# Patient Record
Sex: Male | Born: 1970 | Race: White | Hispanic: No | Marital: Married | Smoking: Former smoker
Health system: Southern US, Community
[De-identification: ages and names within clinical notes are randomized; demographics above are authoritative.]

## PROBLEM LIST (undated history)

## (undated) DIAGNOSIS — R42 Dizziness and giddiness: Secondary | ICD-10-CM

## (undated) DIAGNOSIS — R2689 Other abnormalities of gait and mobility: Secondary | ICD-10-CM

## (undated) DIAGNOSIS — M1990 Unspecified osteoarthritis, unspecified site: Secondary | ICD-10-CM

## (undated) DIAGNOSIS — R519 Headache, unspecified: Secondary | ICD-10-CM

## (undated) DIAGNOSIS — F1910 Other psychoactive substance abuse, uncomplicated: Secondary | ICD-10-CM

## (undated) DIAGNOSIS — E7800 Pure hypercholesterolemia, unspecified: Secondary | ICD-10-CM

## (undated) DIAGNOSIS — R7303 Prediabetes: Secondary | ICD-10-CM

## (undated) DIAGNOSIS — R748 Abnormal levels of other serum enzymes: Secondary | ICD-10-CM

## (undated) DIAGNOSIS — K219 Gastro-esophageal reflux disease without esophagitis: Secondary | ICD-10-CM

## (undated) DIAGNOSIS — K429 Umbilical hernia without obstruction or gangrene: Secondary | ICD-10-CM

## (undated) DIAGNOSIS — Z87442 Personal history of urinary calculi: Secondary | ICD-10-CM

## (undated) DIAGNOSIS — M549 Dorsalgia, unspecified: Secondary | ICD-10-CM

## (undated) HISTORY — PX: UPPER GASTROINTESTINAL ENDOSCOPY: SHX188

## (undated) HISTORY — DX: Other abnormalities of gait and mobility: R26.89

## (undated) HISTORY — PX: COLONOSCOPY: SHX174

## (undated) HISTORY — DX: Dizziness and giddiness: R42

## (undated) HISTORY — PX: HERNIA REPAIR: SHX51

## (undated) HISTORY — DX: Headache, unspecified: R51.9

## (undated) HISTORY — DX: Other psychoactive substance abuse, uncomplicated: F19.10

## (undated) HISTORY — DX: Prediabetes: R73.03

## (undated) HISTORY — DX: Pure hypercholesterolemia, unspecified: E78.00

## (undated) HISTORY — PX: OTHER SURGICAL HISTORY: SHX169

---

## 2008-08-14 ENCOUNTER — Ambulatory Visit: Payer: Self-pay | Admitting: Internal Medicine

## 2008-08-14 DIAGNOSIS — Z8639 Personal history of other endocrine, nutritional and metabolic disease: Secondary | ICD-10-CM

## 2008-08-14 DIAGNOSIS — J309 Allergic rhinitis, unspecified: Secondary | ICD-10-CM | POA: Insufficient documentation

## 2008-08-14 DIAGNOSIS — K219 Gastro-esophageal reflux disease without esophagitis: Secondary | ICD-10-CM | POA: Insufficient documentation

## 2008-08-14 DIAGNOSIS — Z862 Personal history of diseases of the blood and blood-forming organs and certain disorders involving the immune mechanism: Secondary | ICD-10-CM | POA: Insufficient documentation

## 2008-08-14 LAB — CONVERTED CEMR LAB
ALT: 65 units/L — ABNORMAL HIGH (ref 0–53)
AST: 35 units/L (ref 0–37)
Alkaline Phosphatase: 76 units/L (ref 39–117)
Bilirubin, Direct: 0.2 mg/dL (ref 0.0–0.3)
Total Bilirubin: 1 mg/dL (ref 0.3–1.2)

## 2008-08-16 ENCOUNTER — Telehealth: Payer: Self-pay | Admitting: Internal Medicine

## 2009-01-08 ENCOUNTER — Encounter: Payer: Self-pay | Admitting: Internal Medicine

## 2012-01-17 ENCOUNTER — Ambulatory Visit (INDEPENDENT_AMBULATORY_CARE_PROVIDER_SITE_OTHER): Payer: BC Managed Care – PPO | Admitting: Internal Medicine

## 2012-01-17 VITALS — BP 158/90 | HR 83 | Temp 98.9°F | Resp 16 | Ht 73.0 in | Wt 248.0 lb

## 2012-01-17 DIAGNOSIS — J029 Acute pharyngitis, unspecified: Secondary | ICD-10-CM

## 2012-01-17 DIAGNOSIS — J020 Streptococcal pharyngitis: Secondary | ICD-10-CM

## 2012-01-17 MED ORDER — CEFDINIR 300 MG PO CAPS: 300.0000 mg | ORAL_CAPSULE | Freq: Two times a day (BID) | ORAL | Status: AC

## 2012-01-17 NOTE — Patient Instructions (Signed)
Strep pharyngitis:  Take all you medication as prescribed.  You may take 800 mg ibuprophen every 8 hours for 2-3 days for pain and fever.  RTC if not much better in 2 days.  Strep Throat Strep throat is an infection of the throat caused by a bacteria named Streptococcus pyogenes. Your caregiver may call the infection streptococcal "tonsillitis" or "pharyngitis" depending on whether there are signs of inflammation in the tonsils or back of the throat. Strep throat is most common in children from 5 to 20 years old during the cold months of the year, but it can occur in people of any age during any season. This infection is spread from person to person (contagious) through coughing, sneezing, or other close contact. SYMPTOMS   Fever or chills.   Painful, swollen, red tonsils or throat.   Pain or difficulty when swallowing.   Wandel or yellow spots on the tonsils or throat.   Swollen, tender lymph nodes or "glands" of the neck or under the jaw.   Red rash all over the body (rare).  DIAGNOSIS  Many different infections can cause the same symptoms. A test must be done to confirm the diagnosis so the right treatment can be given. A "rapid strep test" can help your caregiver make the diagnosis in a few minutes. If this test is not available, a light swab of the infected area can be used for a throat culture test. If a throat culture test is done, results are usually available in a day or two. TREATMENT  Strep throat is treated with antibiotic medicine. HOME CARE INSTRUCTIONS   Gargle with 1 tsp of salt in 1 cup of warm water, 3 to 4 times per day or as needed for comfort.   Family members who also have a sore throat or fever should be tested for strep throat and treated with antibiotics if they have the strep infection.   Make sure everyone in your household washes their hands well.   Do not share food, drinking cups, or personal items that could cause the infection to spread to others.   You  may need to eat a soft food diet until your sore throat gets better.   Drink enough water and fluids to keep your urine clear or pale yellow. This will help prevent dehydration.   Get plenty of rest.   Stay home from school, daycare, or work until you have been on antibiotics for 24 hours.   Only take over-the-counter or prescription medicines for pain, discomfort, or fever as directed by your caregiver.   If antibiotics are prescribed, take them as directed. Finish them even if you start to feel better.  SEEK MEDICAL CARE IF:   The glands in your neck continue to enlarge.   You develop a rash, cough, or earache.   You cough up green, yellow-brown, or bloody sputum.   You have pain or discomfort not controlled by medicines.   Your problems seem to be getting worse rather than better.  SEEK IMMEDIATE MEDICAL CARE IF:   You develop any new symptoms such as vomiting, severe headache, stiff or painful neck, chest pain, shortness of breath, or trouble swallowing.   You develop severe throat pain, drooling, or changes in your voice.   You develop swelling of the neck, or the skin on the neck becomes red and tender.   You have a fever.   You develop signs of dehydration, such as fatigue, dry mouth, and decreased urination.   You  become increasingly sleepy, or you cannot wake up completely.  Document Released: 10/23/2000 Document Revised: 10/15/2011 Document Reviewed: 12/25/2010 [facility] Patient Information 2012 [place], [place].

## 2012-01-17 NOTE — Progress Notes (Signed)
  Subjective:    Patient ID: Brian Pierce, male    DOB: [birth year], 41 y.o.   MRN: [id number]  Sore Throat  This is a new problem. The current episode started yesterday. The problem has been unchanged. Neither side of throat is experiencing more pain than the other. Pertinent negatives include no abdominal pain, coughing, neck pain, shortness of breath or vomiting. He has had exposure to strep.  Brian Pierce is a pt of Dr. Tempie Donning who has had a sore throat for 2 days, daughter is 49 years old and was recently diagnosed with strep.  He feels feverish and has body aches as well.    Review of Systems  Constitutional: Positive for fever.  HENT: Negative for neck pain.   Eyes: Negative.   Respiratory: Negative.  Negative for cough and shortness of breath.   Cardiovascular: Negative.   Gastrointestinal: Negative.  Negative for vomiting and abdominal pain.  Genitourinary: Negative.   Musculoskeletal: Positive for myalgias.  Skin: Negative for rash.  Neurological: Negative.   Hematological: Negative.   Psychiatric/Behavioral: Negative.        Objective:   Physical Exam  Vitals reviewed. Constitutional: He is oriented to person, place, and time. He appears well-developed and well-nourished.       obese  HENT:  Head: Normocephalic.  Right Ear: External ear normal.  Left Ear: External ear normal.       Oropharynx red with bloody swab for RS  Eyes: Conjunctivae are normal.  Neck: Neck supple.  Cardiovascular: Normal rate, regular rhythm and normal heart sounds.   Abdominal: Soft.  Musculoskeletal: Normal range of motion.  Lymphadenopathy:    He has no cervical adenopathy.  Neurological: He is alert and oriented to person, place, and time.  Skin: Skin is warm and dry.  Psychiatric: He has a normal mood and affect. His behavior is normal.          Assessment & Plan:  Strep pharyngitis:  Cefdinir 300 mg BID for 7 days.  Ibuprophen 800 mg TID for 2 days.  AVS printed and  given to pt, should be able to work after 24 hours on antibiotic.

## 2015-03-14 ENCOUNTER — Other Ambulatory Visit: Payer: Self-pay | Admitting: Internal Medicine

## 2015-03-14 DIAGNOSIS — R748 Abnormal levels of other serum enzymes: Secondary | ICD-10-CM

## 2015-03-21 ENCOUNTER — Other Ambulatory Visit: Payer: Self-pay

## 2015-09-02 ENCOUNTER — Other Ambulatory Visit (HOSPITAL_COMMUNITY): Payer: Self-pay | Admitting: Respiratory Therapy

## 2015-09-02 DIAGNOSIS — J9801 Acute bronchospasm: Secondary | ICD-10-CM

## 2015-09-10 ENCOUNTER — Ambulatory Visit (HOSPITAL_COMMUNITY)
Admission: RE | Admit: 2015-09-10 | Discharge: 2015-09-10 | Disposition: A | Discharge status: Home / Self Care | Payer: 59 | Source: Ambulatory Visit | Attending: Internal Medicine | Admitting: Internal Medicine

## 2015-09-10 DIAGNOSIS — J9801 Acute bronchospasm: Secondary | ICD-10-CM | POA: Diagnosis not present

## 2015-09-10 LAB — PULMONARY FUNCTION TEST
DL/VA % pred: 117 %
DL/VA: 5.53 ml/min/mmHg/L
DLCO UNC % PRED: 108 %
DLCO UNC: 36.64 ml/min/mmHg
FEF 25-75 PRE: 3.8 L/s
FEF 25-75 Post: 1.88 L/sec
FEF2575-%Change-Post: -50 %
FEF2575-%Pred-Post: 50 %
FEF2575-%Pred-Pre: 101 %
FEV1-%CHANGE-POST: -32 %
FEV1-%PRED-POST: 61 %
FEV1-%Pred-Pre: 90 %
FEV1-POST: 2.55 L
FEV1-Pre: 3.79 L
FEV1FVC-%Change-Post: -28 %
FEV1FVC-%PRED-PRE: 102 %
FEV6-%Change-Post: -5 %
FEV6-%PRED-POST: 85 %
FEV6-%Pred-Pre: 90 %
FEV6-POST: 4.43 L
FEV6-PRE: 4.69 L
FEV6FVC-%PRED-POST: 103 %
FEV6FVC-%PRED-PRE: 103 %
FVC-%Change-Post: -5 %
FVC-%Pred-Post: 83 %
FVC-%Pred-Pre: 88 %
FVC-PRE: 4.69 L
FVC-Post: 4.43 L
PRE FEV6/FVC RATIO: 100 %
Post FEV1/FVC ratio: 57 %
Post FEV6/FVC ratio: 100 %
Pre FEV1/FVC ratio: 81 %
RV % PRED: 97 %
RV: 1.95 L
TLC % PRED: 109 %
TLC: 7.86 L

## 2015-09-10 MED ORDER — ALBUTEROL SULFATE (2.5 MG/3ML) 0.083% IN NEBU
2.5000 mg | INHALATION_SOLUTION | Freq: Once | RESPIRATORY_TRACT | Status: AC
  Administered 2015-09-10: 2.5 mg via RESPIRATORY_TRACT

## 2015-12-23 DIAGNOSIS — N390 Urinary tract infection, site not specified: Secondary | ICD-10-CM | POA: Diagnosis not present

## 2015-12-23 DIAGNOSIS — Z125 Encounter for screening for malignant neoplasm of prostate: Secondary | ICD-10-CM | POA: Diagnosis not present

## 2015-12-23 DIAGNOSIS — Z79899 Other long term (current) drug therapy: Secondary | ICD-10-CM | POA: Diagnosis not present

## 2015-12-23 DIAGNOSIS — Z0000 Encounter for general adult medical examination without abnormal findings: Secondary | ICD-10-CM | POA: Diagnosis not present

## 2015-12-23 DIAGNOSIS — Z13220 Encounter for screening for lipoid disorders: Secondary | ICD-10-CM | POA: Diagnosis not present

## 2016-01-13 DIAGNOSIS — R05 Cough: Secondary | ICD-10-CM | POA: Diagnosis not present

## 2016-01-13 DIAGNOSIS — J40 Bronchitis, not specified as acute or chronic: Secondary | ICD-10-CM | POA: Diagnosis not present

## 2016-01-13 MED FILL — levoFLOXacin 750 MG TABS: 750 | 5 days supply | Qty: 5 | Fill #0

## 2016-01-13 MED FILL — CHERATUSSIN AC SYRUP: 100-10 | 6 days supply | Qty: 60 | Fill #0

## 2016-01-13 MED FILL — predniSONE 10 MG (21) TBPK: 10 | 6 days supply | Qty: 21 | Fill #0

## 2016-01-20 DIAGNOSIS — R748 Abnormal levels of other serum enzymes: Secondary | ICD-10-CM | POA: Diagnosis not present

## 2016-01-28 DIAGNOSIS — Z1212 Encounter for screening for malignant neoplasm of rectum: Secondary | ICD-10-CM | POA: Diagnosis not present

## 2016-01-28 DIAGNOSIS — R7309 Other abnormal glucose: Secondary | ICD-10-CM | POA: Diagnosis not present

## 2016-01-28 DIAGNOSIS — Z6833 Body mass index (BMI) 33.0-33.9, adult: Secondary | ICD-10-CM | POA: Diagnosis not present

## 2016-01-28 DIAGNOSIS — Z0000 Encounter for general adult medical examination without abnormal findings: Secondary | ICD-10-CM | POA: Diagnosis not present

## 2016-01-28 DIAGNOSIS — F1010 Alcohol abuse, uncomplicated: Secondary | ICD-10-CM | POA: Diagnosis not present

## 2016-02-14 MED FILL — FLUTICASONE PROP 50 MCG SPR: 50 | 60 days supply | Qty: 16 | Fill #0

## 2016-02-24 MED FILL — ARNUITY ELLIPTA 100 MCG INH: 100 | 30 days supply | Qty: 30 | Fill #0

## 2016-03-20 MED FILL — OMEPRAZOLE DR 20 MG CAPSULE: 20 | 90 days supply | Qty: 90 | Fill #0

## 2016-03-27 MED FILL — ARNUITY ELLIPTA 100 MCG INH: 100 | 30 days supply | Qty: 30 | Fill #1

## 2016-04-21 MED FILL — FLUTICASONE PROP 50 MCG SPR: 50 | 60 days supply | Qty: 16 | Fill #1

## 2016-04-30 MED FILL — ARNUITY ELLIPTA 100 MCG INH: 100 | 30 days supply | Qty: 30 | Fill #2

## 2016-06-08 MED FILL — FLUTICASONE PROP 50 MCG SPR: 50 | 60 days supply | Qty: 16 | Fill #2

## 2016-06-08 MED FILL — ARNUITY ELLIPTA 100 MCG INH: 100 | 30 days supply | Qty: 30 | Fill #3

## 2016-07-20 MED FILL — ARNUITY ELLIPTA 100 MCG INH: 100 | 30 days supply | Qty: 30 | Fill #4

## 2016-08-31 MED FILL — ARNUITY ELLIPTA 100 MCG INH: 100 | 30 days supply | Qty: 30 | Fill #5

## 2016-08-31 MED FILL — FLUTICASONE PROP 50 MCG SPR: 50 | 60 days supply | Qty: 16 | Fill #3

## 2016-09-30 ENCOUNTER — Emergency Department (HOSPITAL_COMMUNITY)
Admission: EM | Admit: 2016-09-30 | Discharge: 2016-09-30 | Disposition: A | Discharge status: Home / Self Care | Payer: 59 | Attending: Emergency Medicine | Admitting: Emergency Medicine

## 2016-09-30 ENCOUNTER — Emergency Department (HOSPITAL_COMMUNITY): Payer: 59

## 2016-09-30 ENCOUNTER — Encounter (HOSPITAL_COMMUNITY): Payer: Self-pay

## 2016-09-30 DIAGNOSIS — N202 Calculus of kidney with calculus of ureter: Secondary | ICD-10-CM | POA: Insufficient documentation

## 2016-09-30 DIAGNOSIS — N201 Calculus of ureter: Secondary | ICD-10-CM | POA: Diagnosis not present

## 2016-09-30 DIAGNOSIS — N200 Calculus of kidney: Secondary | ICD-10-CM | POA: Diagnosis not present

## 2016-09-30 DIAGNOSIS — R1030 Lower abdominal pain, unspecified: Secondary | ICD-10-CM | POA: Diagnosis present

## 2016-09-30 LAB — CBC WITH DIFFERENTIAL/PLATELET
Basophils Absolute: 0 10*3/uL (ref 0.0–0.1)
Basophils Relative: 0 %
EOS PCT: 2 %
Eosinophils Absolute: 0.2 10*3/uL (ref 0.0–0.7)
HCT: 45.4 % (ref 39.0–52.0)
HEMOGLOBIN: 16.2 g/dL (ref 13.0–17.0)
LYMPHS ABS: 4.3 10*3/uL — AB (ref 0.7–4.0)
LYMPHS PCT: 43 %
MCH: 31.6 pg (ref 26.0–34.0)
MCHC: 35.7 g/dL (ref 30.0–36.0)
MCV: 88.7 fL (ref 78.0–100.0)
MONOS PCT: 8 %
Monocytes Absolute: 0.8 10*3/uL (ref 0.1–1.0)
Neutro Abs: 4.6 10*3/uL (ref 1.7–7.7)
Neutrophils Relative %: 47 %
Platelets: 219 10*3/uL (ref 150–400)
RBC: 5.12 MIL/uL (ref 4.22–5.81)
RDW: 12.2 % (ref 11.5–15.5)
WBC: 9.9 10*3/uL (ref 4.0–10.5)

## 2016-09-30 LAB — URINALYSIS, ROUTINE W REFLEX MICROSCOPIC
Bilirubin Urine: NEGATIVE
GLUCOSE, UA: NEGATIVE mg/dL
KETONES UR: NEGATIVE mg/dL
LEUKOCYTES UA: NEGATIVE
Nitrite: NEGATIVE
PH: 6 (ref 5.0–8.0)
PROTEIN: NEGATIVE mg/dL
Specific Gravity, Urine: 1.027 (ref 1.005–1.030)

## 2016-09-30 LAB — BASIC METABOLIC PANEL
Anion gap: 11 (ref 5–15)
BUN: 18 mg/dL (ref 6–20)
CHLORIDE: 106 mmol/L (ref 101–111)
CO2: 22 mmol/L (ref 22–32)
Calcium: 9.5 mg/dL (ref 8.9–10.3)
Creatinine, Ser: 0.99 mg/dL (ref 0.61–1.24)
GFR calc Af Amer: >60 mL/min (ref >60)
GFR calc non Af Amer: >60 mL/min (ref >60)
GLUCOSE: 110 mg/dL — AB (ref 65–99)
POTASSIUM: 3.8 mmol/L (ref 3.5–5.1)
Sodium: 139 mmol/L (ref 135–145)

## 2016-09-30 LAB — URINE MICROSCOPIC-ADD ON

## 2016-09-30 IMAGING — CT CT RENAL STONE PROTOCOL
2 of 4 series · 16 of 46 positions shown, 18 images · non-contrast
Comparison: None.

CLINICAL DATA: Right flank pain radiating to the right groin,
starting at 1 a.m..

EXAM:
CT ABDOMEN AND PELVIS WITHOUT CONTRAST
TECHNIQUE: Multidetector CT imaging of the abdomen and pelvis was performed
following the standard protocol without IV contrast.

[Series 2: renal stone 5.0 · axial · 0.82mm/px · z∈[-899,-409]mm · 13 of 108 slices shown, 15 images]
[im 5/108  soft-tissue]
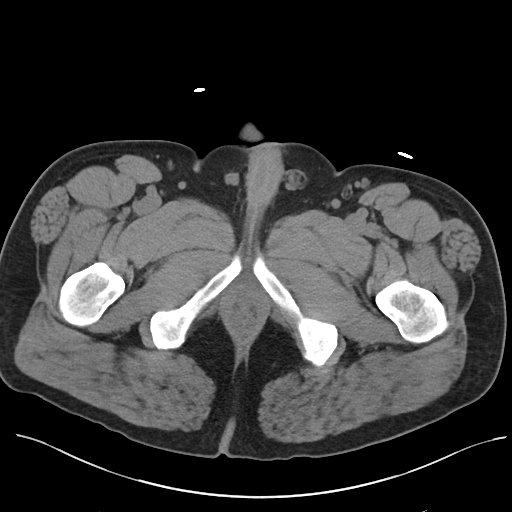
[im 5/108  bone]
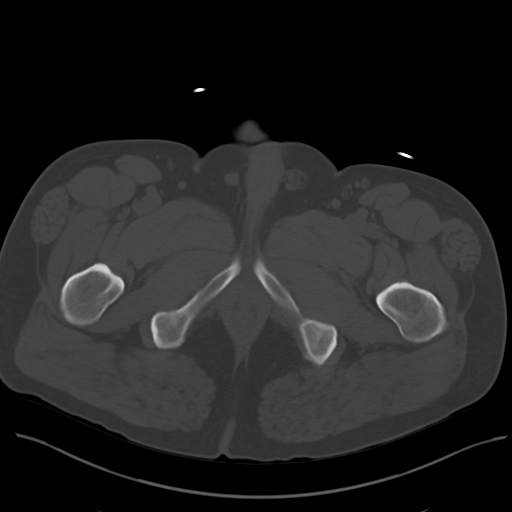
[im 13/108  soft-tissue]
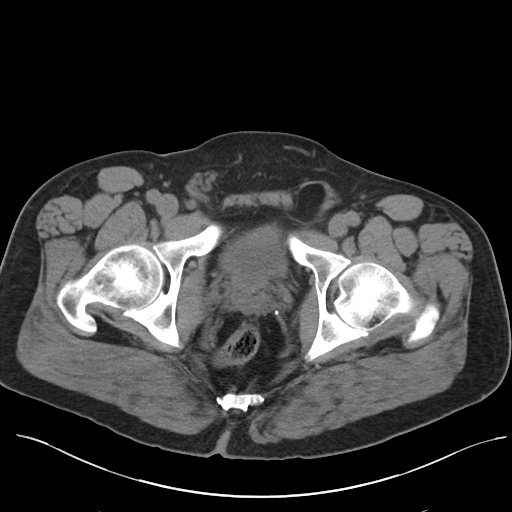
[im 22/108  soft-tissue]
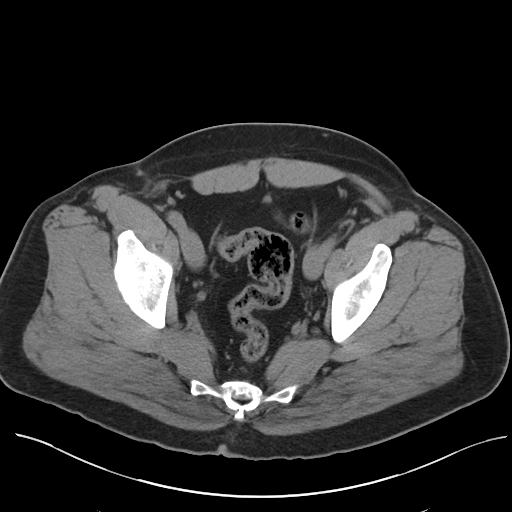
[im 30/108  soft-tissue]
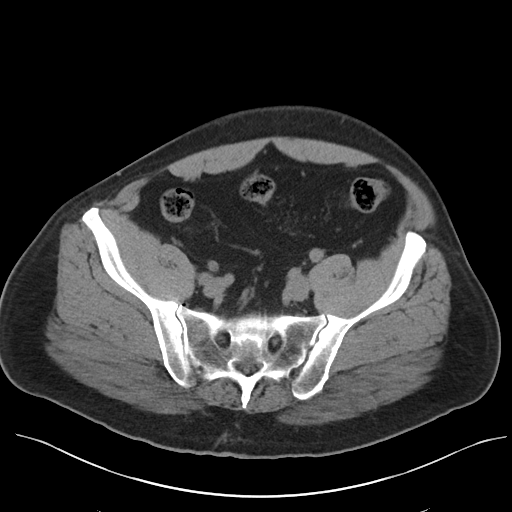
[im 39/108  soft-tissue]
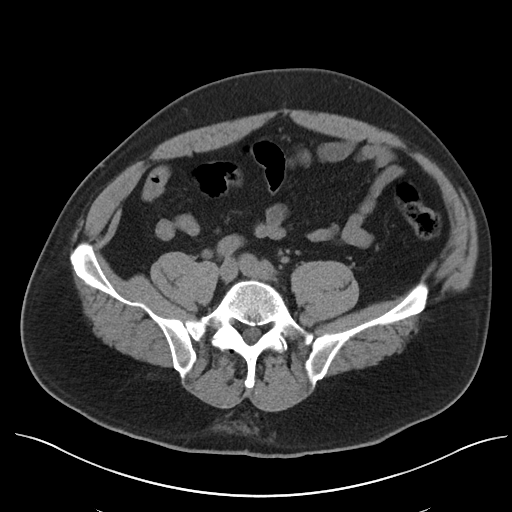
[im 48/108  soft-tissue]
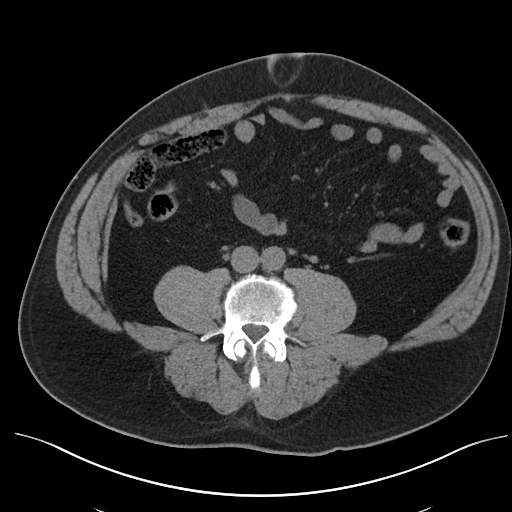
[im 56/108  soft-tissue]
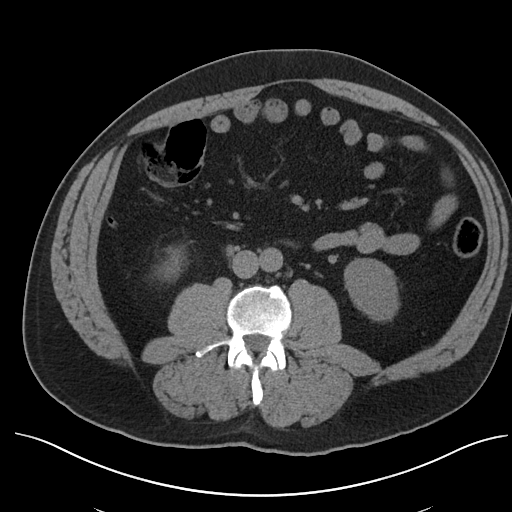
[im 60/108  soft-tissue]
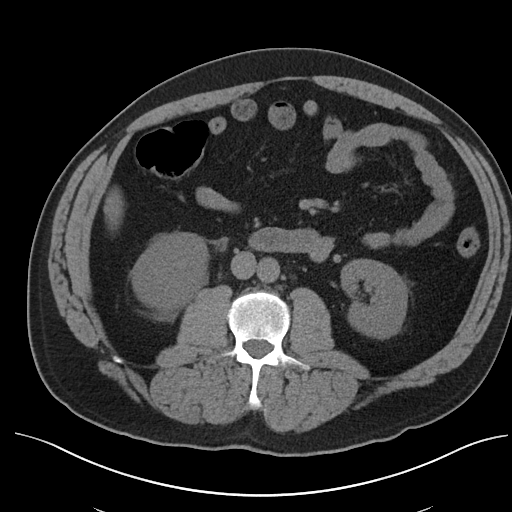
[im 69/108  soft-tissue]
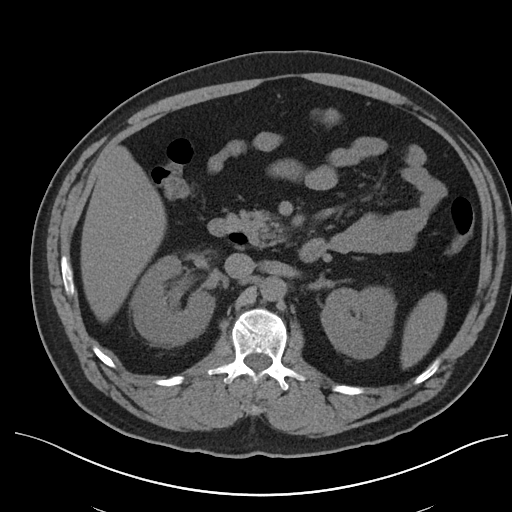
[im 69/108  bone]
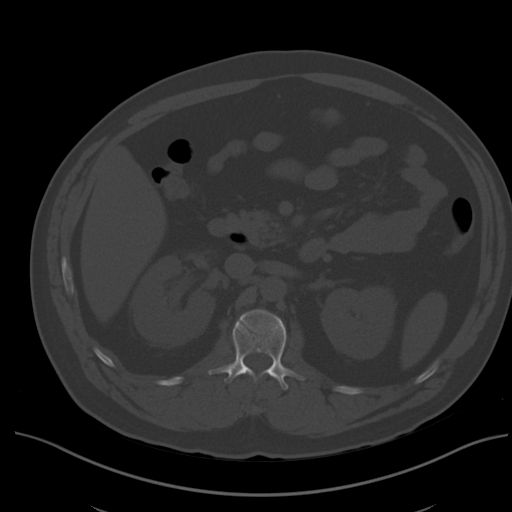
[im 78/108  soft-tissue]
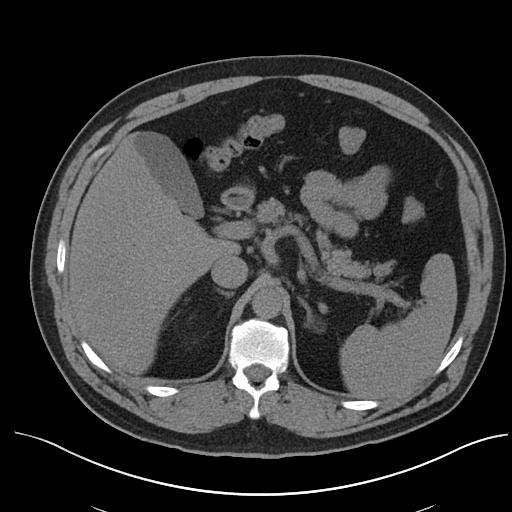
[im 86/108  soft-tissue]
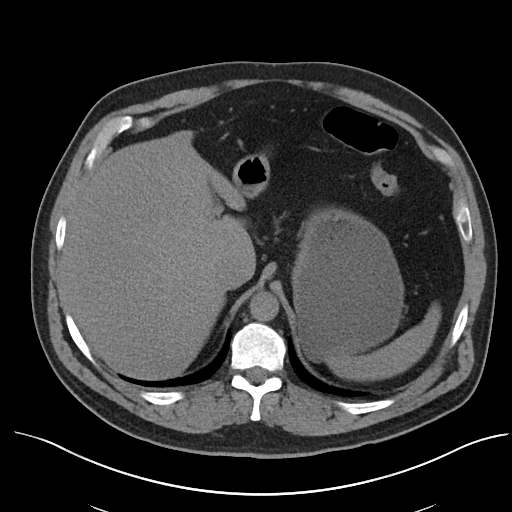
[im 95/108  soft-tissue]
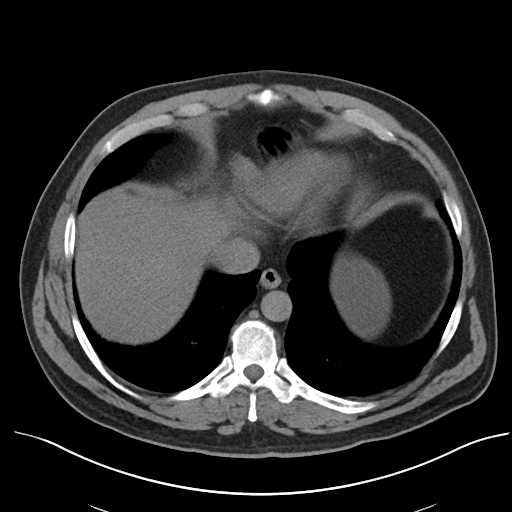
[im 103/108  soft-tissue]
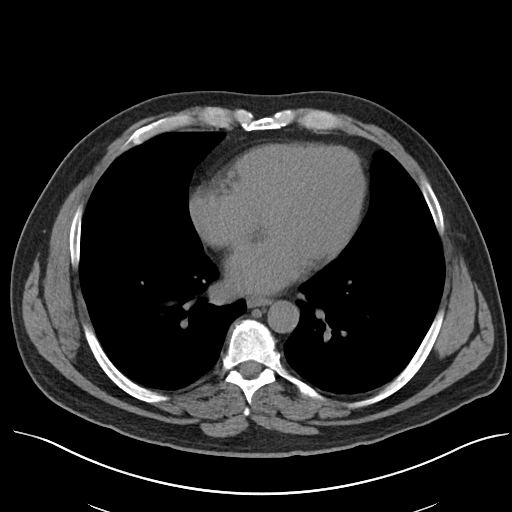

[Series 4: renal stone 3.0 cor · coronal · 0.85mm/px · 3 of 101 slices shown]
[im 34/101  soft-tissue]
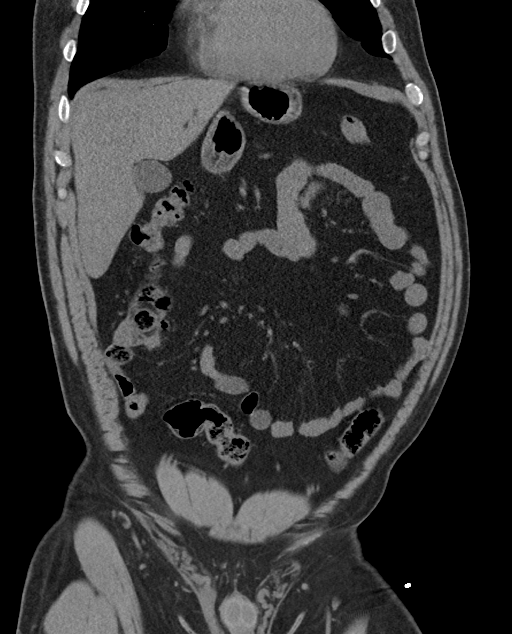
[im 45/101  soft-tissue]
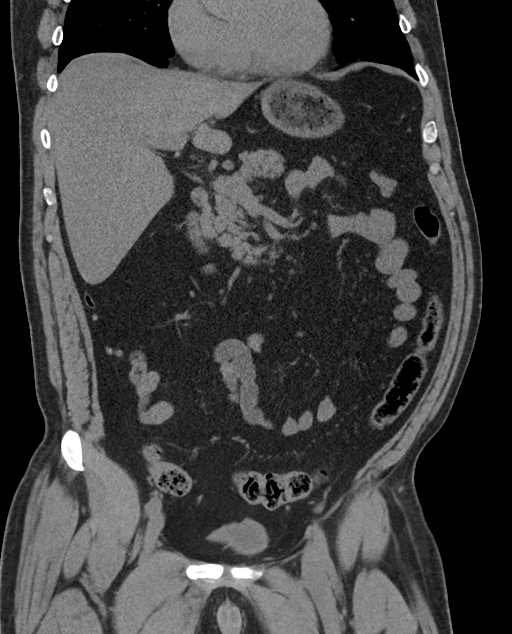
[im 56/101  soft-tissue]
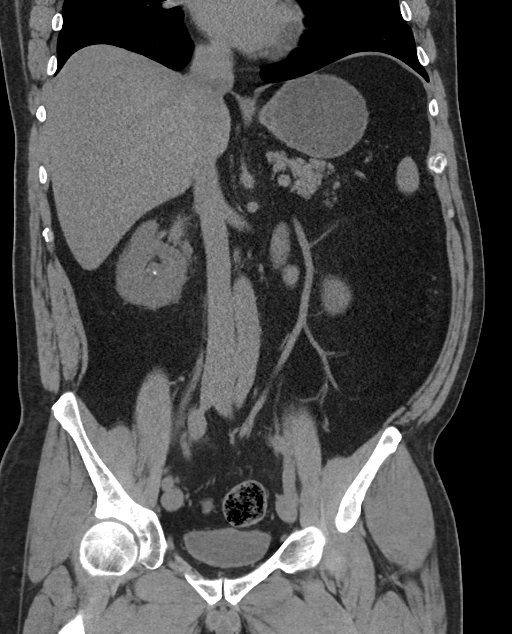

[16 of 46 positions shown; findings below may reference images not displayed]

FINDINGS: Lower chest: Mild dependent changes in the lung bases.

Hepatobiliary: No focal liver abnormality is seen. No gallstones,
gallbladder wall thickening, or biliary dilatation.

Pancreas: Unremarkable. No pancreatic ductal dilatation or
surrounding inflammatory changes.

Spleen: Normal in size without focal abnormality.

Adrenals/Urinary Tract: No adrenal gland nodules. Tiny stone
demonstrated in the distal right ureter at the ureterovesical
junction measuring about 2 mm diameter. There is proximal
hydronephrosis and hydroureter on the right with stranding around
the right kidney. Scattered tiny punctate stones demonstrated in
both kidneys. No hydronephrosis on the left. Bladder wall is not
thickened and no bladder stones are visualized.

Stomach/Bowel: Stomach is within normal limits. Appendix appears
normal. No evidence of bowel wall thickening, distention, or
inflammatory changes.

Vascular/Lymphatic: No significant vascular findings are present. No
enlarged abdominal or pelvic lymph nodes.

Reproductive: Prostate is unremarkable.

Other: Small periumbilical hernia containing fat. No free air or
free fluid in the abdomen.

Musculoskeletal: No acute or significant osseous findings.
IMPRESSION: 2 mm stone in the distal right ureter with moderate proximal
obstruction. Additional bilateral punctate size nonobstructing
intrarenal stones.

## 2016-09-30 MED ORDER — ONDANSETRON HCL 4 MG/2ML IJ SOLN
4.0000 mg | Freq: Once | INTRAMUSCULAR | Status: AC
  Administered 2016-09-30: 4 mg via INTRAVENOUS
  Filled 2016-09-30: qty 2

## 2016-09-30 MED ORDER — MORPHINE SULFATE (PF) 4 MG/ML IV SOLN
4.0000 mg | Freq: Once | INTRAVENOUS | Status: AC
  Administered 2016-09-30: 4 mg via INTRAVENOUS
  Filled 2016-09-30: qty 1

## 2016-09-30 MED ORDER — KETOROLAC TROMETHAMINE 15 MG/ML IJ SOLN
15.0000 mg | Freq: Once | INTRAMUSCULAR | Status: AC
  Administered 2016-09-30: 15 mg via INTRAVENOUS
  Filled 2016-09-30: qty 1

## 2016-09-30 MED ORDER — OXYCODONE-ACETAMINOPHEN 5-325 MG PO TABS: 1.0000 | ORAL_TABLET | ORAL | 0 refills | Status: DC | PRN

## 2016-09-30 MED ORDER — HYDROMORPHONE HCL 2 MG/ML IJ SOLN
1.0000 mg | Freq: Once | INTRAMUSCULAR | Status: AC
  Administered 2016-09-30: 1 mg via INTRAVENOUS
  Filled 2016-09-30: qty 1

## 2016-09-30 MED ORDER — OXYCODONE-ACETAMINOPHEN 5-325 MG PO TABS
1.0000 | ORAL_TABLET | Freq: Once | ORAL | Status: AC
  Administered 2016-09-30: 1 via ORAL
  Filled 2016-09-30: qty 1

## 2016-09-30 MED ORDER — OXYCODONE-ACETAMINOPHEN 5-325 MG PO TABS
1.0000 | ORAL_TABLET | ORAL | Status: DC | PRN
  Administered 2016-09-30: 1 via ORAL

## 2016-09-30 MED ORDER — TAMSULOSIN HCL 0.4 MG PO CAPS: 0.4000 mg | ORAL_CAPSULE | Freq: Every day | ORAL | 0 refills | Status: DC

## 2016-09-30 MED ORDER — OXYCODONE-ACETAMINOPHEN 5-325 MG PO TABS
ORAL_TABLET | ORAL | Status: AC
  Filled 2016-09-30: qty 1

## 2016-09-30 MED ORDER — ONDANSETRON 4 MG PO TBDP: 4.0000 mg | ORAL_TABLET | Freq: Three times a day (TID) | ORAL | 0 refills | Status: DC | PRN

## 2016-09-30 MED ORDER — SODIUM CHLORIDE 0.9 % IV BOLUS (SEPSIS)
1000.0000 mL | Freq: Once | INTRAVENOUS | Status: AC
Start: 2016-09-30 — End: 2016-09-30
  Administered 2016-09-30: 1000 mL via INTRAVENOUS

## 2016-09-30 MED FILL — TAMSULOSIN HCL 0.4 MG CAP: 0.4 | 5 days supply | Qty: 5 | Fill #0

## 2016-09-30 MED FILL — OXYCODONE W/APAP 5/325 TAB: 5-325 | 2 days supply | Qty: 15 | Fill #0

## 2016-09-30 MED FILL — ONDANSETRON ODT 4 MG TABLET: 4 | 7 days supply | Qty: 20 | Fill #0

## 2016-09-30 NOTE — ED Provider Notes (Signed)
[facility] DEPT Provider Note   CSN: [id number] Arrival date & time: 09/30/16  0124  By signing my name below, I, Brian Pierce, attest that this documentation has been prepared under the direction and in the presence of Brian Hacker, MD . Electronically Signed: Dolores Pierce, Scribe. 09/30/2016. 3:23 AM.  History   Chief Complaint Chief Complaint  Patient presents with  . Flank Pain   The history is provided by the patient. No language interpreter was used.    HPI Comments:  Brian Pierce is a 45 y.o. male who presents to the Emergency Department complaining of sudden-onset constant unchanged lower right flank pain beginning earlier tonight. Pt describes his pain as 10/10 sharp pain that radiates to his groin. He reports associates dysuria, nausea and dizziness. Pt denies any hematuria, fever, vomiting, CP or SOB. He is compliant with his at home inhaler medications  History reviewed. No pertinent past medical history.  Patient Active Problem List   Diagnosis Date Noted  . ALLERGIC RHINITIS 08/14/2008  . GERD 08/14/2008  . LIVER FUNCTION TESTS, ABNORMAL, HX OF 08/14/2008    History reviewed. No pertinent surgical history.     Home Medications    Prior to Admission medications   Medication Sig Start Date End Date Taking? Authorizing Provider  Fluticasone Furoate (ARNUITY ELLIPTA) 100 MCG/ACT AEPB Inhale 1 puff into the lungs daily.   Yes Historical Provider, MD  ondansetron (ZOFRAN ODT) 4 MG disintegrating tablet Take 1 tablet (4 mg total) by mouth every 8 (eight) hours as needed for nausea or vomiting. 09/30/16   Brian Hacker, MD  oxyCODONE-acetaminophen (PERCOCET/ROXICET) 5-325 MG tablet Take 1-2 tablets by mouth every 4 (four) hours as needed for severe pain. 09/30/16   Brian Hacker, MD  tamsulosin (FLOMAX) 0.4 MG CAPS capsule Take 1 capsule (0.4 mg total) by mouth daily. 09/30/16   Brian Hacker, MD    Family History No family history on  file.  Social History Social History  Substance Use Topics  . Smoking status: Never Smoker  . Smokeless tobacco: Not on file  . Alcohol use Yes     Allergies   Codone [hydrocodone]   Review of Systems Review of Systems  Constitutional: Negative for fever.  Respiratory: Negative for shortness of breath.   Cardiovascular: Negative for chest pain.  Gastrointestinal: Positive for nausea. Negative for vomiting.  Genitourinary: Positive for dysuria and flank pain. Negative for hematuria.  Neurological: Positive for dizziness.  All other systems reviewed and are negative.    Physical Exam Updated Vital Signs BP 142/77   Pulse 82   Resp 26   SpO2 96%   Physical Exam  Constitutional: He is oriented to person, place, and time. He appears well-developed and well-nourished.  Overweight, uncomfortable appearing  HENT:  Head: Normocephalic and atraumatic.  Cardiovascular: Normal rate, regular rhythm and normal heart sounds.   No murmur heard. Pulmonary/Chest: Effort normal and breath sounds normal. No respiratory distress. He has no wheezes.  Abdominal: Soft. Bowel sounds are normal. There is no tenderness. There is no rebound.  Genitourinary:  Genitourinary Comments: No CVA tenderness  Musculoskeletal: He exhibits no edema.  Neurological: He is alert and oriented to person, place, and time.  Skin: Skin is warm and dry.  Psychiatric: He has a normal mood and affect.  Nursing note and vitals reviewed.    ED Treatments / Results  DIAGNOSTIC STUDIES:  Oxygen Saturation is 100% on RA, normal by my interpretation.    COORDINATION  OF CARE:  3:23 AM Discussed treatment plan with pt at bedside which includes urinalysis and pt agreed to plan.  Labs (all labs ordered are listed, but only abnormal results are displayed) Labs Reviewed  URINALYSIS, ROUTINE W REFLEX MICROSCOPIC (NOT AT [facility]) - Abnormal; Notable for the following:       Result Value   Color, Urine AMBER (*)     APPearance TURBID (*)    Hgb urine dipstick MODERATE (*)    All other components within normal limits  URINE MICROSCOPIC-ADD ON - Abnormal; Notable for the following:    Squamous Epithelial / LPF 0-5 (*)    Bacteria, UA FEW (*)    All other components within normal limits  CBC WITH DIFFERENTIAL/PLATELET - Abnormal; Notable for the following:    Lymphs Abs 4.3 (*)    All other components within normal limits  BASIC METABOLIC PANEL - Abnormal; Notable for the following:    Glucose, Bld 110 (*)    All other components within normal limits    EKG  EKG Interpretation None       Radiology Ct Renal Stone Study  Result Date: 09/30/2016 CLINICAL DATA:  Right flank pain radiating to the right groin, starting at 1 a.m. EXAM: CT ABDOMEN AND PELVIS WITHOUT CONTRAST TECHNIQUE: Multidetector CT imaging of the abdomen and pelvis was performed following the standard protocol without IV contrast. COMPARISON:  None. FINDINGS: Lower chest: Mild dependent changes in the lung bases. Hepatobiliary: No focal liver abnormality is seen. No gallstones, gallbladder wall thickening, or biliary dilatation. Pancreas: Unremarkable. No pancreatic ductal dilatation or surrounding inflammatory changes. Spleen: Normal in size without focal abnormality. Adrenals/Urinary Tract: No adrenal gland nodules. Tiny stone demonstrated in the distal right ureter at the ureterovesical junction measuring about 2 mm diameter. There is proximal hydronephrosis and hydroureter on the right with stranding around the right kidney. Scattered tiny punctate stones demonstrated in both kidneys. No hydronephrosis on the left. Bladder wall is not thickened and no bladder stones are visualized. Stomach/Bowel: Stomach is within normal limits. Appendix appears normal. No evidence of bowel wall thickening, distention, or inflammatory changes. Vascular/Lymphatic: No significant vascular findings are present. No enlarged abdominal or pelvic lymph nodes.  Reproductive: Prostate is unremarkable. Other: Small periumbilical hernia containing fat. No free air or free fluid in the abdomen. Musculoskeletal: No acute or significant osseous findings. IMPRESSION: 2 mm stone in the distal right ureter with moderate proximal obstruction. Additional bilateral punctate size nonobstructing intrarenal stones. Electronically Signed   By: Lucienne Capers M.D.   On: 09/30/2016 05:05    Procedures Procedures (including critical care time)  Medications Ordered in ED Medications  oxyCODONE-acetaminophen (PERCOCET/ROXICET) 5-325 MG per tablet 1 tablet (1 tablet Oral Given 09/30/16 0150)  sodium chloride 0.9 % bolus 1,000 mL (0 mLs Intravenous Stopped 09/30/16 0609)  morphine 4 MG/ML injection 4 mg (4 mg Intravenous Given 09/30/16 0335)  ondansetron (ZOFRAN) injection 4 mg (4 mg Intravenous Given 09/30/16 0334)  HYDROmorphone (DILAUDID) injection 1 mg (1 mg Intravenous Given 09/30/16 0457)  oxyCODONE-acetaminophen (PERCOCET/ROXICET) 5-325 MG per tablet 1 tablet (1 tablet Oral Given 09/30/16 0615)  ketorolac (TORADOL) 15 MG/ML injection 15 mg (15 mg Intravenous Given 09/30/16 0615)     Initial Impression / Assessment and Plan / ED Course  I have reviewed the triage vital signs and the nursing notes.  Pertinent labs & imaging results that were available during my care of the patient were reviewed by me and considered in my medical decision making (  see chart for details).  Clinical Course     Patient presents with right-sided flank pain. History most consistent with kidney stone. He is uncomfortable appearing. Vital signs reassuring. Urinalysis with moderate hemoglobin. No evidence of infection. Creatinine 0.99. CT with 2 mm obstructing stone. Likely the etiology of the patient's symptoms.  On multiple rechecks, he is improving. He was given Toradol in addition to narcotic pain medication. He is able to tolerate fluids. Expectant management and urology  follow-up.  After history, exam, and medical workup I feel the patient has been appropriately medically screened and is safe for discharge home. Pertinent diagnoses were discussed with the patient. Patient was given return precautions.   Final Clinical Impressions(s) / ED Diagnoses   Final diagnoses:  Kidney stone    New Prescriptions New Prescriptions   ONDANSETRON (ZOFRAN ODT) 4 MG DISINTEGRATING TABLET    Take 1 tablet (4 mg total) by mouth every 8 (eight) hours as needed for nausea or vomiting.   OXYCODONE-ACETAMINOPHEN (PERCOCET/ROXICET) 5-325 MG TABLET    Take 1-2 tablets by mouth every 4 (four) hours as needed for severe pain.   TAMSULOSIN (FLOMAX) 0.4 MG CAPS CAPSULE    Take 1 capsule (0.4 mg total) by mouth daily.   I personally performed the services described in this documentation, which was scribed in my presence. The recorded information has been reviewed and is accurate.     Brian Hacker, MD 09/30/16 [phone]

## 2016-09-30 NOTE — ED Notes (Signed)
Pt to CT

## 2016-09-30 NOTE — ED Triage Notes (Signed)
Pt states he started having right flank pain about an hour ago; pt states nausea just started; pt a&ox 4 on arrival. Pt denies urinary symptoms; Pt is doubled over in pain at triage;

## 2016-11-20 DIAGNOSIS — S39012A Strain of muscle, fascia and tendon of lower back, initial encounter: Secondary | ICD-10-CM | POA: Diagnosis not present

## 2016-11-20 DIAGNOSIS — M5441 Lumbago with sciatica, right side: Secondary | ICD-10-CM | POA: Diagnosis not present

## 2016-11-20 MED FILL — traMADol HCL 50 MG TABS: 50 | 15 days supply | Qty: 60 | Fill #0

## 2016-11-20 MED FILL — CYCLOBENZAPRINE 10 MG TAB: 10 | 30 days supply | Qty: 90 | Fill #0

## 2017-01-04 MED FILL — OMEPRAZOLE DR 20 MG CAPSULE: 20 | 90 days supply | Qty: 90 | Fill #0

## 2017-02-17 MED FILL — FLUTICASONE PROP 50 MCG SPR: 50 | 90 days supply | Qty: 32 | Fill #0

## 2017-02-17 MED FILL — ALL DAY ALLERGY 10 MG TAB: 10 | 100 days supply | Qty: 100 | Fill #0

## 2017-03-09 DIAGNOSIS — Z0000 Encounter for general adult medical examination without abnormal findings: Secondary | ICD-10-CM | POA: Diagnosis not present

## 2017-03-22 DIAGNOSIS — J309 Allergic rhinitis, unspecified: Secondary | ICD-10-CM | POA: Diagnosis not present

## 2017-03-22 DIAGNOSIS — M545 Low back pain: Secondary | ICD-10-CM | POA: Diagnosis not present

## 2017-03-22 DIAGNOSIS — K219 Gastro-esophageal reflux disease without esophagitis: Secondary | ICD-10-CM | POA: Diagnosis not present

## 2017-03-22 DIAGNOSIS — F1010 Alcohol abuse, uncomplicated: Secondary | ICD-10-CM | POA: Diagnosis not present

## 2017-03-22 MED FILL — CYCLOBENZAPRINE 10 MG TAB: 10 | 90 days supply | Qty: 270 | Fill #0

## 2017-05-07 MED FILL — OMEPRAZOLE DR 20 MG CAPSULE: 20 | 90 days supply | Qty: 90 | Fill #1

## 2017-05-07 MED FILL — ALL DAY ALLERGY 10 MG TAB: 10 | 100 days supply | Qty: 100 | Fill #1

## 2017-05-11 MED FILL — FLUTICASONE PROP 50 MCG SPR: 50 | 90 days supply | Qty: 32 | Fill #1

## 2017-07-13 DIAGNOSIS — M79671 Pain in right foot: Secondary | ICD-10-CM | POA: Diagnosis not present

## 2017-07-13 DIAGNOSIS — F419 Anxiety disorder, unspecified: Secondary | ICD-10-CM | POA: Diagnosis not present

## 2017-07-13 DIAGNOSIS — M19071 Primary osteoarthritis, right ankle and foot: Secondary | ICD-10-CM | POA: Diagnosis not present

## 2017-08-04 MED FILL — OMEPRAZOLE 20 MG CAP: 20 | 90 days supply | Qty: 90 | Fill #2

## 2017-08-04 MED FILL — ALL DAY ALLERGY 10 MG TAB: 10 | 100 days supply | Qty: 100 | Fill #2

## 2017-12-29 ENCOUNTER — Ambulatory Visit: Payer: Self-pay | Admitting: Nurse Practitioner

## 2017-12-29 VITALS — BP 130/90 | HR 86 | Temp 98.8°F | Resp 16 | Wt 253.0 lb

## 2017-12-29 DIAGNOSIS — R062 Wheezing: Secondary | ICD-10-CM

## 2017-12-29 DIAGNOSIS — J069 Acute upper respiratory infection, unspecified: Secondary | ICD-10-CM

## 2017-12-29 DIAGNOSIS — R6889 Other general symptoms and signs: Secondary | ICD-10-CM

## 2017-12-29 LAB — POCT INFLUENZA A/B
Influenza A, POC: NEGATIVE
Influenza B, POC: NEGATIVE

## 2017-12-29 MED ORDER — PREDNISONE 10 MG (21) PO TBPK: ORAL_TABLET | ORAL | 0 refills | Status: AC

## 2017-12-29 MED ORDER — AZITHROMYCIN 250 MG PO TABS: ORAL_TABLET | ORAL | 0 refills | Status: AC

## 2017-12-29 MED ORDER — ALBUTEROL SULFATE HFA 108 (90 BASE) MCG/ACT IN AERS: 2.0000 | INHALATION_SPRAY | Freq: Four times a day (QID) | RESPIRATORY_TRACT | 0 refills | Status: DC | PRN

## 2017-12-29 NOTE — Progress Notes (Signed)
Subjective:  Brian Pierce is a 47 y.o. male who presents for evaluation of URI like symptoms.  Symptoms include achiness, cough described as non-productive and mild, chills, facial pain, fever: fevers up to 102.3 degrees, headache described as throbbing, shortness of breath and wheezing.  Onset of symptoms was 5 days ago, and has been gradually worsening since that time.  Treatment to date:  Theraflu.  High risk factors for influenza complications:  patient's family was diagnosed with influenza.  The following portions of the patient's history were reviewed and updated as appropriate:  allergies, current medications and past medical history.  Constitutional: positive for anorexia, chills, fatigue, fevers and malaise, negative for sweats and weight loss Eyes: negative Ears, nose, mouth, throat, and face: positive for earaches and sore throat, negative for ear drainage, hearing loss, hoarseness and nasal congestion Respiratory: positive for cough and wheezing, negative for asthma, pleurisy/chest pain, pneumonia, sputum and stridor Cardiovascular: negative Gastrointestinal: positive for nausea and anorexia, negative for abdominal pain, change in bowel habits, constipation, diarrhea and vomiting Neurological: positive for headaches, negative for coordination problems, dizziness, paresthesia, speech problems, tremors and weakness Objective:  BP 130/90 (BP Location: Right Arm, Patient Position: Sitting, Cuff Size: Normal)   Pulse 86   Temp 98.8 F (37.1 C) (Oral)   Resp 16   Wt 253 lb (114.8 kg)   SpO2 94%   BMI 33.38 kg/m  General appearance: alert, cooperative, fatigued and no distress Head: Normocephalic, without obvious abnormality, atraumatic Eyes: conjunctivae/corneas clear. PERRL, EOM's intact. Fundi benign. Ears: normal TM's and external ear canals both ears Nose: no discharge, turbinates swollen, inflamed, no sinus tenderness Throat: lips, mucosa, and tongue normal; teeth and gums  normal Lungs: wheezes LLL, LUL, RLL, RML and RUL Heart: regular rate and rhythm, S1, S2 normal, no murmur, click, rub or gallop Abdomen: soft, non-tender; bowel sounds normal; no masses,  no organomegaly Pulses: 2+ and symmetric Skin: Skin color, texture, turgor normal. No rashes or lesions Lymph nodes: cervical and submandibular nodes normal Neurologic: Grossly normal    Assessment:  Bacterial Upper Respiratory Infection and Wheezing    Plan:  Educational material distributed and questions answered. Suggested symptomatic OTC remedies. Supportive care with appropriate antipyretics and fluids. Nasal saline spray for congestion. Zithromax and Sterapred Dose Pack and Albuterol Inhaler per orders. Follow up as needed.

## 2017-12-29 NOTE — Patient Instructions (Addendum)
Upper Respiratory Infection, Adult Most upper respiratory infections (URIs) are a viral infection of the air passages leading to the lungs. A URI affects the nose, throat, and upper air passages. The most common type of URI is nasopharyngitis and is typically referred to as "the common cold." URIs run their course and usually go away on their own. Most of the time, a URI does not require medical attention, but sometimes a bacterial infection in the upper airways can follow a viral infection. This is called a secondary infection. Sinus and middle ear infections are common types of secondary upper respiratory infections. Bacterial pneumonia can also complicate a URI. A URI can worsen asthma and chronic obstructive pulmonary disease (COPD). Sometimes, these complications can require emergency medical care and may be life threatening. What are the causes? Almost all URIs are caused by viruses. A virus is a type of germ and can spread from one person to another. What increases the risk? You may be at risk for a URI if:  You smoke.  You have chronic heart or lung disease.  You have a weakened defense (immune) system.  You are very young or very old.  You have nasal allergies or asthma.  You work in crowded or poorly ventilated areas.  You work in health care facilities or schools.  What are the signs or symptoms? Symptoms typically develop 2-3 days after you come in contact with a cold virus. Most viral URIs last 7-10 days. However, viral URIs from the influenza virus (flu virus) can last 14-18 days and are typically more severe. Symptoms may include:  Runny or stuffy (congested) nose.  Sneezing.  Cough.  Sore throat.  Headache.  Fatigue.  Fever.  Loss of appetite.  Pain in your forehead, behind your eyes, and over your cheekbones (sinus pain).  Muscle aches.  How is this diagnosed? Your health care provider may diagnose a URI by:  Physical exam.  Tests to check that your  symptoms are not due to another condition such as: ? Strep throat. ? Sinusitis. ? Pneumonia. ? Asthma.  How is this treated? A URI goes away on its own with time. It cannot be cured with medicines, but medicines may be prescribed or recommended to relieve symptoms. Medicines may help:  Reduce your fever.  Reduce your cough.  Relieve nasal congestion.  Follow these instructions at home:  Take medicines only as directed by your health care provider.  Gargle warm saltwater or take cough drops to comfort your throat as directed by your health care provider.  Use a warm mist humidifier or inhale steam from a shower to increase air moisture. This may make it easier to breathe.  Drink enough fluid to keep your urine clear or pale yellow.  Eat soups and other clear broths and maintain good nutrition.  Rest as needed.  Return to work when your temperature has returned to normal or as your health care provider advises. You may need to stay home longer to avoid infecting others. You can also use a face mask and careful hand washing to prevent spread of the virus.  Increase the usage of your inhaler if you have asthma.  Do not use any tobacco products, including cigarettes, chewing tobacco, or electronic cigarettes. If you need help quitting, ask your health care provider. How is this prevented? The best way to protect yourself from getting a cold is to practice good hygiene.  Avoid oral or hand contact with people with cold symptoms.  Wash your   hands often if contact occurs.  There is no clear evidence that vitamin C, vitamin E, echinacea, or exercise reduces the chance of developing a cold. However, it is always recommended to get plenty of rest, exercise, and practice good nutrition. Contact a health care provider if:  You are getting worse rather than better.  Your symptoms are not controlled by medicine.  You have chills.  You have worsening shortness of breath.  You have  brown or red mucus.  You have yellow or brown nasal discharge.  You have pain in your face, especially when you bend forward.  You have a fever.  You have swollen neck glands.  You have pain while swallowing.  You have [place] areas in the back of your throat. Get help right away if:  You have severe or persistent: ? Headache. ? Ear pain. ? Sinus pain. ? Chest pain.  You have chronic lung disease and any of the following: ? Wheezing. ? Prolonged cough. ? Coughing up blood. ? A change in your usual mucus.  You have a stiff neck.  You have changes in your: ? Vision. ? Hearing. ? Thinking. ? Mood. This information is not intended to replace advice given to you by your health care provider. Make sure you discuss any questions you have with your health care provider. Document Released: 04/21/2001 Document Revised: 06/28/2016 Document Reviewed: 01/31/2014 Elsevier Interactive Patient Education  2018 [employer].  Bronchospasm, Adult Bronchospasm is a tightening of the airways going into the lungs. During an episode, it may be harder to breathe. You may cough, and you may make a whistling sound when you breathe (wheeze). This condition often affects people with asthma. What are the causes? This condition is caused by swelling and irritation in the airways. It can be triggered by:  An infection (common).  Seasonal allergies.  An allergic reaction.  Exercise.  Irritants. These include pollution, cigarette smoke, strong odors, aerosol sprays, and paint fumes.  Weather changes. Winds increase molds and pollens in the air. Cold air may cause swelling.  Stress and emotional upset.  What are the signs or symptoms? Symptoms of this condition include:  Wheezing. If the episode was triggered by an allergy, wheezing may start right away or hours later.  Nighttime coughing.  Frequent or severe coughing with a simple cold.  Chest tightness.  Shortness of  breath.  Decreased ability to exercise.  How is this diagnosed? This condition is usually diagnosed with a review of your medical history and a physical exam. Tests, such as lung function tests, are sometimes done to look for other conditions. The need for a chest X-ray depends on where the wheezing occurs and whether it is the first time you have wheezed. How is this treated? This condition may be treated with:  Inhaled medicines. These open up the airways and help you breathe. They can be taken with an inhaler or a nebulizer device.  Corticosteroid medicines. These may be given for severe bronchospasm, usually when it is associated with asthma.  Avoiding triggers, such as irritants, infection, or allergies.  Follow these instructions at home: Medicines  Take over-the-counter and prescription medicines only as told by your health care provider.  If you need to use an inhaler or nebulizer to take your medicine, ask your health care provider to explain how to use it correctly. If you were given a spacer, always use it with your inhaler. Lifestyle  Reduce the number of triggers in your home. To do this: ?  Change your heating and air conditioning filter at least once a month. ? Limit your use of fireplaces and wood stoves. ? Do not smoke. Do not allow smoking in your home. ? Avoid using perfumes and fragrances. ? Get rid of pests, such as roaches and mice, and their droppings. ? Remove any mold from your home. ? Keep your house clean and dust free. Use unscented cleaning products. ? Replace carpet with wood, tile, or vinyl flooring. Carpet can trap dander and dust. ? Use allergy-proof pillows, mattress covers, and box spring covers. ? Wash bed sheets and blankets every week in hot water. Dry them in a dryer. ? Use blankets that are made of polyester or cotton. ? Wash your hands often. ? Do not allow pets in your bedroom.  Avoid breathing in cold air when you exercise. General  instructions  Have a plan for seeking medical care. Know when to call your health care provider and local emergency services, and where to get emergency care.  Stay up to date on your immunizations.  When you have an episode of bronchospasm, stay calm. Try to relax and breathe more slowly.  If you have asthma, make sure you have an asthma action plan.  Keep all follow-up visits as told by your health care provider. This is important. Contact a health care provider if:  You have muscle aches.  You have chest pain.  The mucus that you cough up (sputum) changes from clear or [place] to yellow, green, gray, or bloody.  You have a fever.  Your sputum gets thicker. Get help right away if:  Your wheezing and coughing get worse, even after you take your prescribed medicines.  It gets even harder to breathe.  You develop severe chest pain. Summary  Bronchospasm is a tightening of the airways going into the lungs.  During an episode of bronchospasm, you may have a harder time breathing. You may cough and make a whistling sound when you breathe (wheeze).  Avoid exposure to triggers such as smoke, dust, mold, animal dander, and fragrances.  When you have an episode of bronchospasm, stay calm. Try to relax and breathe more slowly. This information is not intended to replace advice given to you by your health care provider. Make sure you discuss any questions you have with your health care provider. Document Released: 10/29/2003 Document Revised: 10/22/2016 Document Reviewed: 10/22/2016 Elsevier Interactive Patient Education  2017 [employer].

## 2017-12-31 ENCOUNTER — Telehealth: Payer: Self-pay | Admitting: Emergency Medicine

## 2017-12-31 NOTE — Telephone Encounter (Signed)
Called patient to follow up with them since their visit with [facility] left vm

## 2018-02-02 MED FILL — CETIRIZINE HCL 10 MG TABS: 10 | 60 days supply | Qty: 60 | Fill #3

## 2018-02-02 MED FILL — FLUTICASONE PROP 50 MCG SPR: 50 | 90 days supply | Qty: 32 | Fill #2

## 2018-03-03 ENCOUNTER — Ambulatory Visit: Payer: Self-pay | Admitting: Nurse Practitioner

## 2018-03-03 DIAGNOSIS — Z0000 Encounter for general adult medical examination without abnormal findings: Secondary | ICD-10-CM

## 2018-06-09 DIAGNOSIS — R748 Abnormal levels of other serum enzymes: Secondary | ICD-10-CM

## 2018-06-09 HISTORY — DX: Abnormal levels of other serum enzymes: R74.8

## 2018-06-16 ENCOUNTER — Emergency Department (HOSPITAL_COMMUNITY): Payer: 59

## 2018-06-16 ENCOUNTER — Emergency Department (HOSPITAL_COMMUNITY)
Admission: EM | Admit: 2018-06-16 | Discharge: 2018-06-16 | Disposition: A | Discharge status: Home / Self Care | Payer: 59 | Attending: Emergency Medicine | Admitting: Emergency Medicine

## 2018-06-16 ENCOUNTER — Other Ambulatory Visit: Payer: Self-pay

## 2018-06-16 ENCOUNTER — Encounter (HOSPITAL_COMMUNITY): Payer: Self-pay | Admitting: Emergency Medicine

## 2018-06-16 DIAGNOSIS — R079 Chest pain, unspecified: Secondary | ICD-10-CM | POA: Insufficient documentation

## 2018-06-16 DIAGNOSIS — F10120 Alcohol abuse with intoxication, uncomplicated: Secondary | ICD-10-CM | POA: Diagnosis not present

## 2018-06-16 DIAGNOSIS — F1410 Cocaine abuse, uncomplicated: Secondary | ICD-10-CM | POA: Diagnosis not present

## 2018-06-16 DIAGNOSIS — R0602 Shortness of breath: Secondary | ICD-10-CM | POA: Diagnosis not present

## 2018-06-16 DIAGNOSIS — Z79899 Other long term (current) drug therapy: Secondary | ICD-10-CM | POA: Diagnosis not present

## 2018-06-16 DIAGNOSIS — R0789 Other chest pain: Secondary | ICD-10-CM | POA: Diagnosis not present

## 2018-06-16 DIAGNOSIS — F1010 Alcohol abuse, uncomplicated: Secondary | ICD-10-CM

## 2018-06-16 DIAGNOSIS — R51 Headache: Secondary | ICD-10-CM | POA: Diagnosis present

## 2018-06-16 LAB — COMPREHENSIVE METABOLIC PANEL
ALBUMIN: 4.4 g/dL (ref 3.5–5.0)
ALT: 64 U/L — ABNORMAL HIGH (ref 0–44)
ANION GAP: 12 (ref 5–15)
AST: 37 U/L (ref 15–41)
Alkaline Phosphatase: 68 U/L (ref 38–126)
BUN: 16 mg/dL (ref 6–20)
CHLORIDE: 104 mmol/L (ref 98–111)
CO2: 24 mmol/L (ref 22–32)
Calcium: 9.5 mg/dL (ref 8.9–10.3)
Creatinine, Ser: 1.05 mg/dL (ref 0.61–1.24)
GFR calc Af Amer: >60 mL/min (ref >60)
GFR calc non Af Amer: >60 mL/min (ref >60)
GLUCOSE: 103 mg/dL — AB (ref 70–99)
POTASSIUM: 4.2 mmol/L (ref 3.5–5.1)
SODIUM: 140 mmol/L (ref 135–145)
Total Bilirubin: 1.4 mg/dL — ABNORMAL HIGH (ref 0.3–1.2)
Total Protein: 7.4 g/dL (ref 6.5–8.1)

## 2018-06-16 LAB — CBC WITH DIFFERENTIAL/PLATELET
ABS IMMATURE GRANULOCYTES: 0 10*3/uL (ref 0.0–0.1)
BASOS ABS: 0.1 10*3/uL (ref 0.0–0.1)
Basophils Relative: 1 %
Eosinophils Absolute: 0 10*3/uL (ref 0.0–0.7)
Eosinophils Relative: 0 %
HCT: 50.7 % (ref 39.0–52.0)
HEMOGLOBIN: 17 g/dL (ref 13.0–17.0)
IMMATURE GRANULOCYTES: 0 %
LYMPHS PCT: 25 %
Lymphs Abs: 3 10*3/uL (ref 0.7–4.0)
MCH: 30.6 pg (ref 26.0–34.0)
MCHC: 33.5 g/dL (ref 30.0–36.0)
MCV: 91.4 fL (ref 78.0–100.0)
MONOS PCT: 7 %
Monocytes Absolute: 0.9 10*3/uL (ref 0.1–1.0)
NEUTROS ABS: 8.2 10*3/uL — AB (ref 1.7–7.7)
NEUTROS PCT: 67 %
PLATELETS: 183 10*3/uL (ref 150–400)
RBC: 5.55 MIL/uL (ref 4.22–5.81)
RDW: 12.5 % (ref 11.5–15.5)
WBC: 12.3 10*3/uL — ABNORMAL HIGH (ref 4.0–10.5)

## 2018-06-16 LAB — BASIC METABOLIC PANEL
Anion gap: 15 (ref 5–15)
BUN: 15 mg/dL (ref 6–20)
CHLORIDE: 104 mmol/L (ref 98–111)
CO2: 21 mmol/L — ABNORMAL LOW (ref 22–32)
Calcium: 9.8 mg/dL (ref 8.9–10.3)
Creatinine, Ser: 1 mg/dL (ref 0.61–1.24)
Glucose, Bld: 106 mg/dL — ABNORMAL HIGH (ref 70–99)
POTASSIUM: 4.4 mmol/L (ref 3.5–5.1)
SODIUM: 140 mmol/L (ref 135–145)

## 2018-06-16 LAB — URINALYSIS, ROUTINE W REFLEX MICROSCOPIC
Bilirubin Urine: NEGATIVE
Glucose, UA: NEGATIVE mg/dL
Hgb urine dipstick: NEGATIVE
Ketones, ur: 5 mg/dL — AB
LEUKOCYTES UA: NEGATIVE
NITRITE: NEGATIVE
Protein, ur: NEGATIVE mg/dL
SPECIFIC GRAVITY, URINE: 1.02 (ref 1.005–1.030)
pH: 6 (ref 5.0–8.0)

## 2018-06-16 LAB — CBC
HEMATOCRIT: 50.5 % (ref 39.0–52.0)
Hemoglobin: 17.2 g/dL — ABNORMAL HIGH (ref 13.0–17.0)
MCH: 30.9 pg (ref 26.0–34.0)
MCHC: 34.1 g/dL (ref 30.0–36.0)
MCV: 90.7 fL (ref 78.0–100.0)
PLATELETS: 220 10*3/uL (ref 150–400)
RBC: 5.57 MIL/uL (ref 4.22–5.81)
RDW: 12.4 % (ref 11.5–15.5)
WBC: 12.8 10*3/uL — AB (ref 4.0–10.5)

## 2018-06-16 LAB — ETHANOL

## 2018-06-16 LAB — TROPONIN I

## 2018-06-16 IMAGING — DX DG CHEST 2V
2 series · 2 of 2 positions shown · non-contrast
Comparison: [DATE]

CLINICAL DATA: Chest pain, shortness of breath and left-sided
headache

EXAM:
CHEST - 2 VIEW

[chest pa]
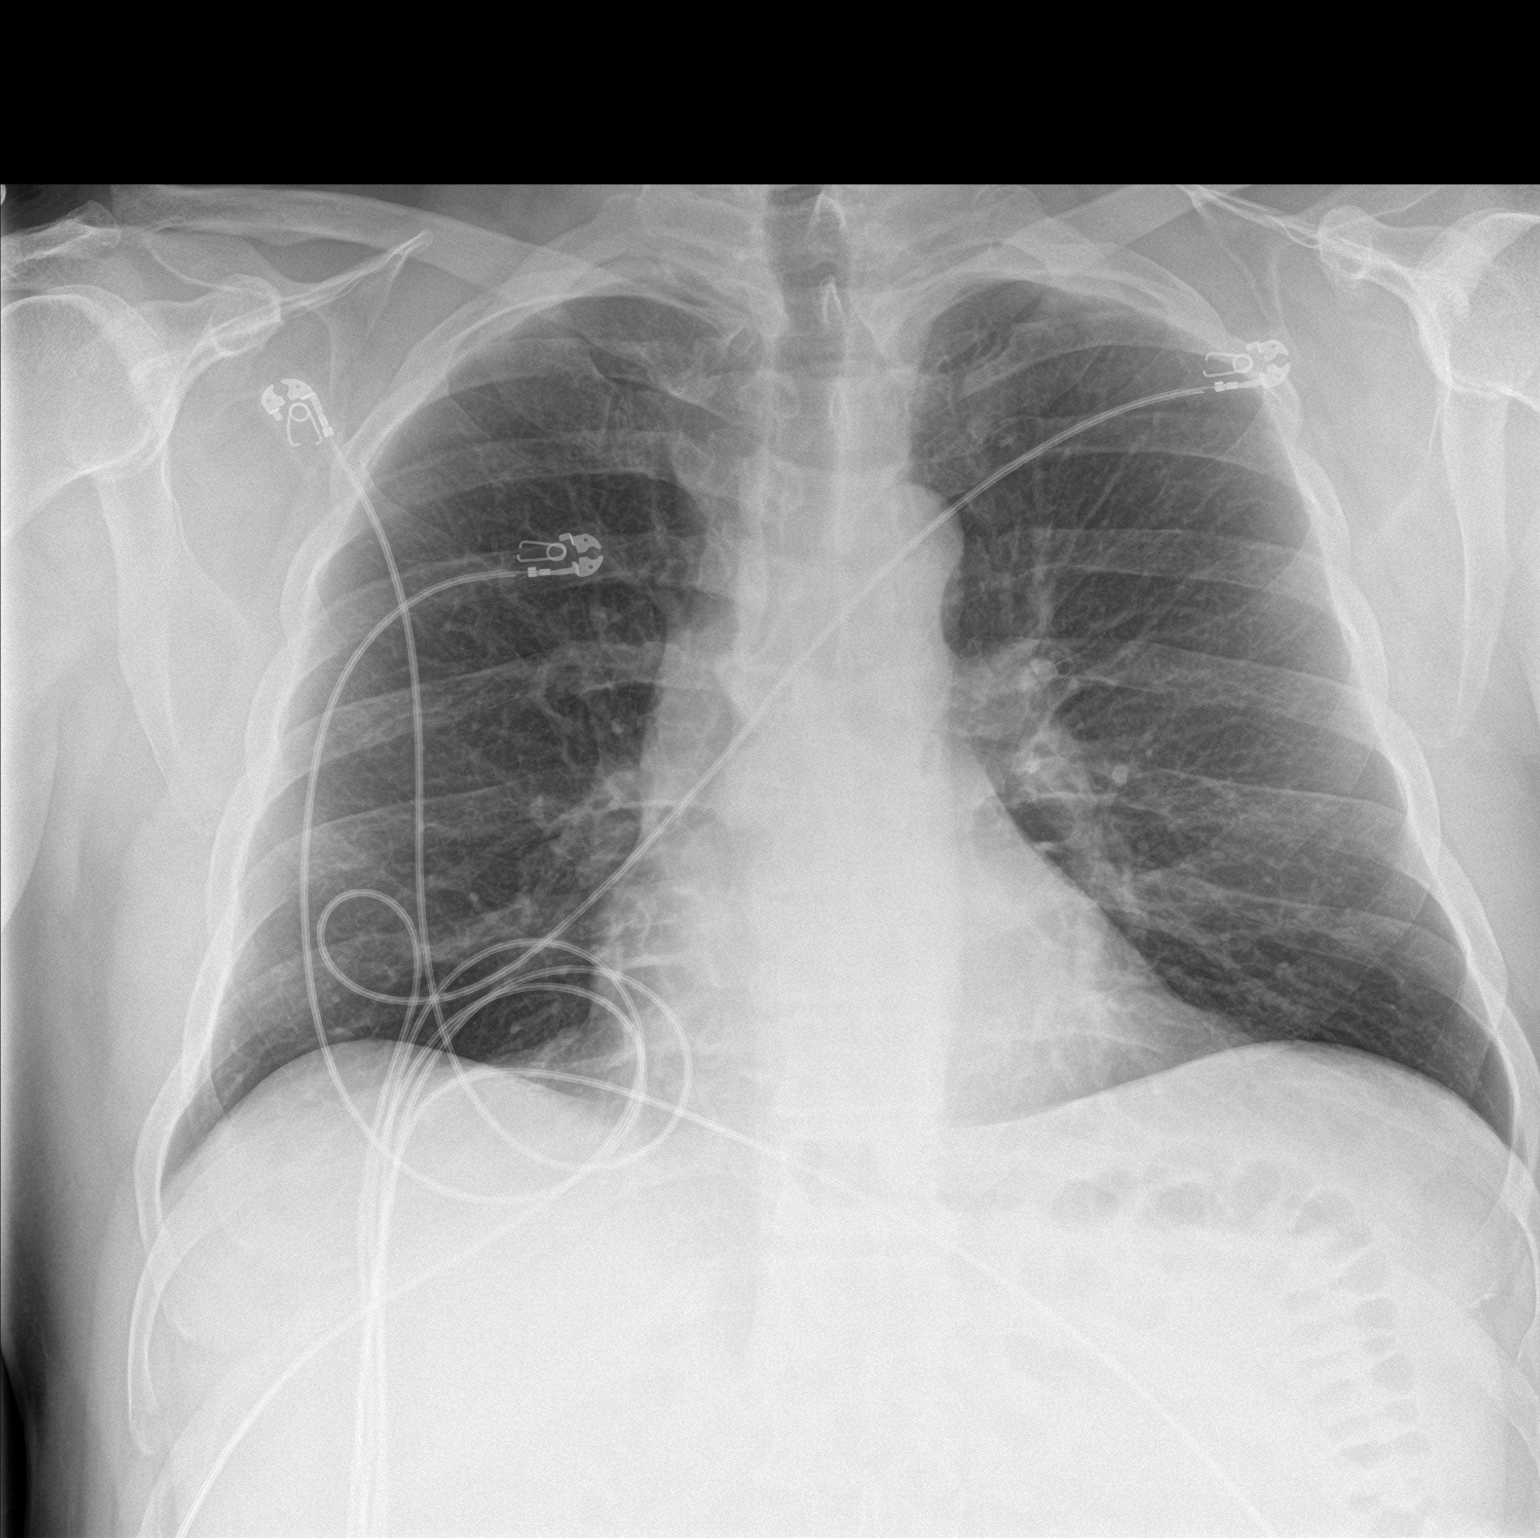

[chest lat]
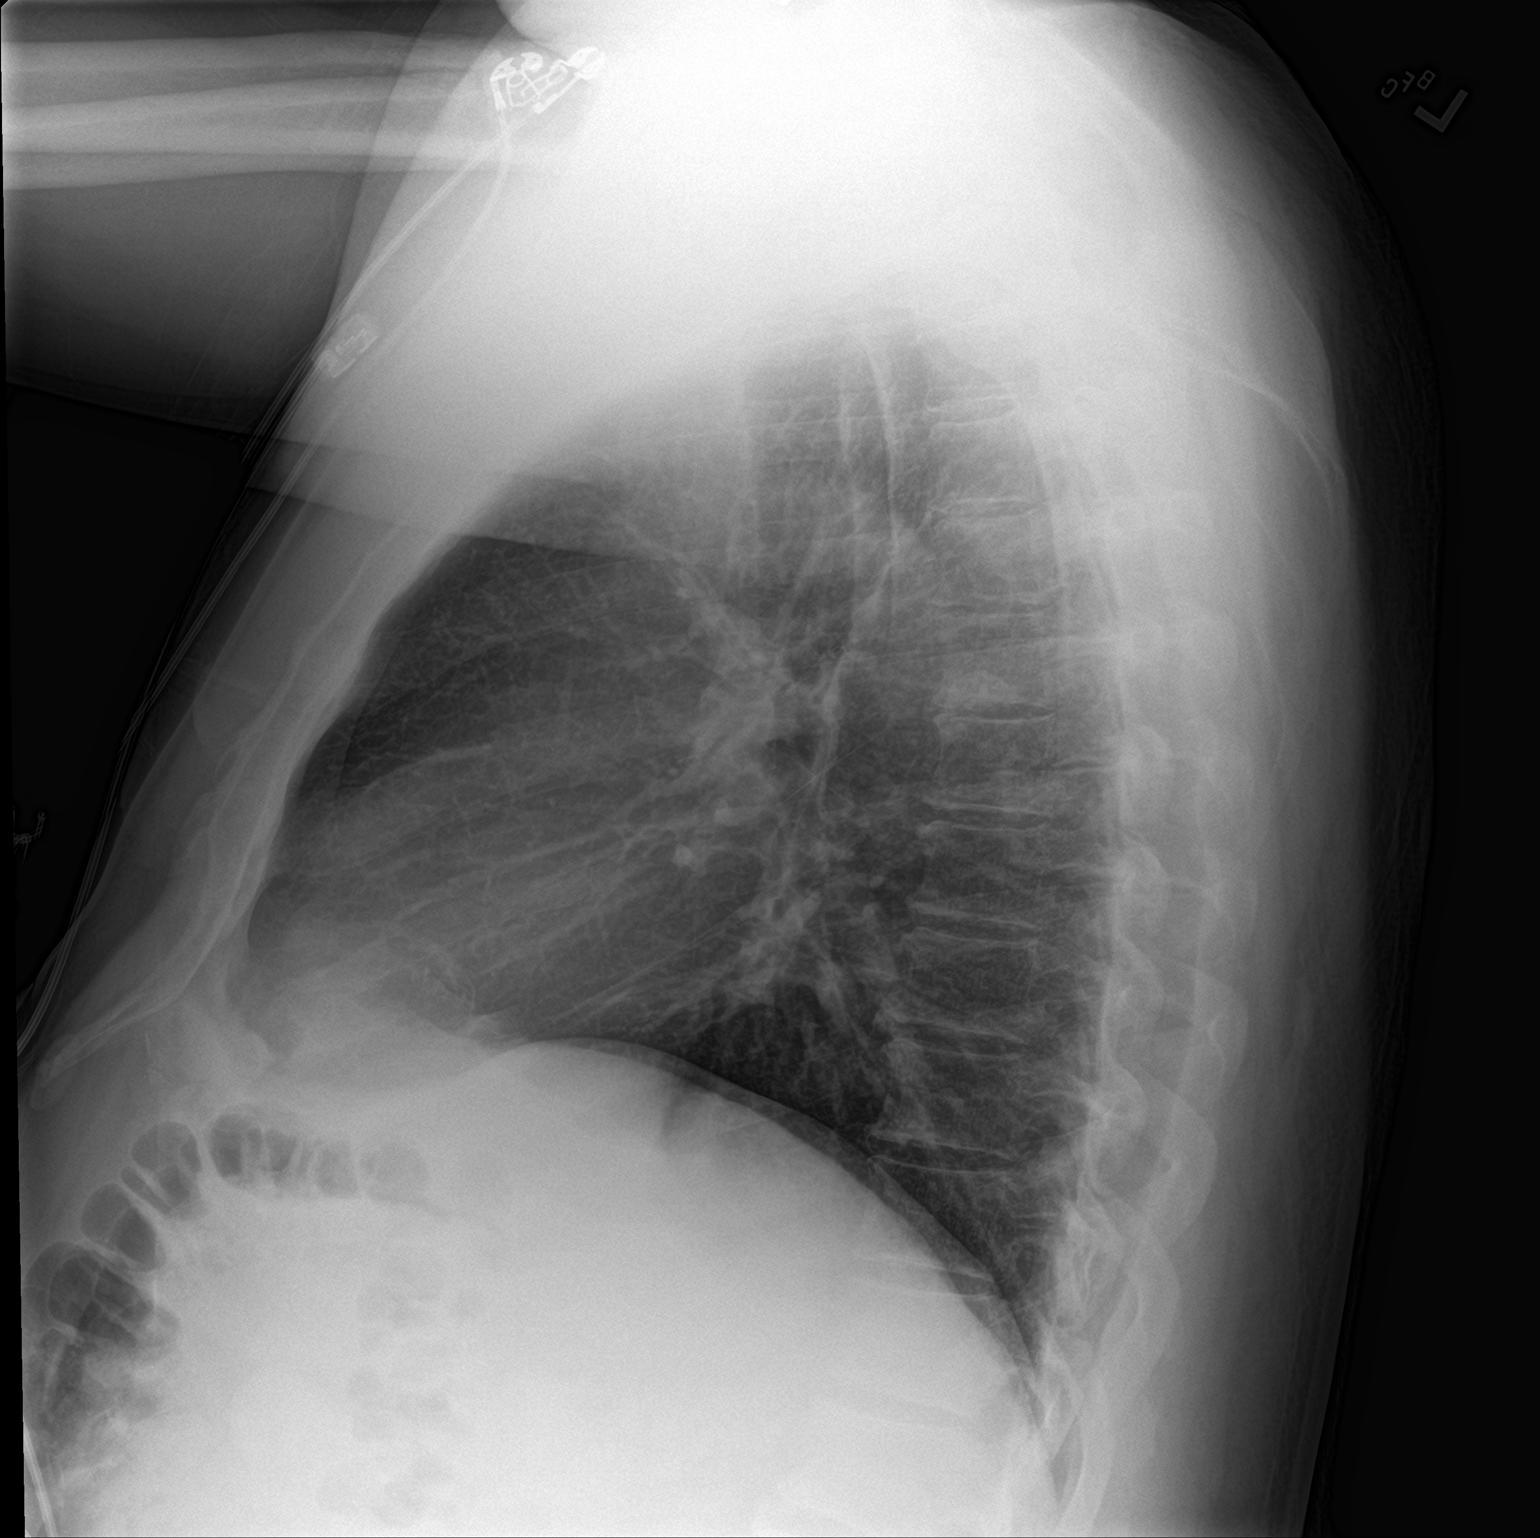

[2 of 2 positions shown; findings below may reference images not displayed]

FINDINGS: The heart size and mediastinal contours are within normal limits.
Both lungs are clear. The visualized skeletal structures are
unremarkable.
IMPRESSION: No active cardiopulmonary disease.

## 2018-06-16 MED ORDER — SODIUM CHLORIDE 0.9 % IV BOLUS
1000.0000 mL | Freq: Once | INTRAVENOUS | Status: AC
  Administered 2018-06-16: 1000 mL via INTRAVENOUS

## 2018-06-16 MED ORDER — ASPIRIN 81 MG PO CHEW
324.0000 mg | CHEWABLE_TABLET | Freq: Once | ORAL | Status: AC
  Administered 2018-06-16: 324 mg via ORAL
  Filled 2018-06-16: qty 4

## 2018-06-16 MED ORDER — DIPHENHYDRAMINE HCL 50 MG/ML IJ SOLN
25.0000 mg | Freq: Once | INTRAMUSCULAR | Status: AC
  Administered 2018-06-16: 25 mg via INTRAVENOUS
  Filled 2018-06-16: qty 1

## 2018-06-16 MED ORDER — METOCLOPRAMIDE HCL 5 MG/ML IJ SOLN
10.0000 mg | Freq: Once | INTRAMUSCULAR | Status: AC
  Administered 2018-06-16: 10 mg via INTRAVENOUS
  Filled 2018-06-16: qty 2

## 2018-06-16 NOTE — ED Notes (Signed)
See downtime record

## 2018-06-16 NOTE — ED Provider Notes (Signed)
[facility] EM[facility] DEPARTMENT Provider Note   CSN: [id number] Arrival date & time: 06/16/18  0125     History   Chief Complaint Chief Complaint  Patient presents with  . Headache  . Alcohol Intoxication    HPI Brian Pierce is a 47 y.o. male.  The history is provided by the patient.  He states he had onset at midnight of a left hemicranial headache which described as like something was pulling on his head.  There is also some midsternal chest tightness.  Symptoms have been waxing and waning.  Pain was rated at 8/10 at its worst, is now down to 4/10.  There was some visual disturbance initially, but that has resolved.  There was nausea but no vomiting.  He denies dyspnea or diaphoresis.  He did notice photophobia initially, but that has resolved.  There was no phonophobia.  He admits to consuming a large amount of alcohol tonight (9 beers +5 shots) as well as cocaine use tonight.  He does smoke half pack of cigarettes a day but denies history of hypertension or diabetes or hyperlipidemia.  There is no family history of premature coronary atherosclerosis.  No past medical history on file.  Patient Active Problem List   Diagnosis Date Noted  . ALLERGIC RHINITIS 08/14/2008  . GERD 08/14/2008  . LIVER FUNCTION TESTS, ABNORMAL, HX OF 08/14/2008    No past surgical history on file.      Home Medications    Prior to Admission medications   Medication Sig Start Date End Date Taking? Authorizing Provider  albuterol (PROVENTIL HFA) 108 (90 Base) MCG/ACT inhaler Inhale 2 puffs into the lungs every 6 (six) hours as needed for up to 7 days for wheezing or shortness of breath. 12/29/17 01/05/18  Kara Dies, NP  Fluticasone Furoate (ARNUITY ELLIPTA) 100 MCG/ACT AEPB Inhale 1 puff into the lungs daily.    [provider]  ibuprofen (ADVIL,MOTRIN) 400 MG tablet Take 400 mg by mouth every 6 (six) hours as needed.    [provider]    ondansetron (ZOFRAN ODT) 4 MG disintegrating tablet Take 1 tablet (4 mg total) by mouth every 8 (eight) hours as needed for nausea or vomiting. Patient not taking: Reported on 12/29/2017 09/30/16   Horton, Barbette Hair, MD  oxyCODONE-acetaminophen (PERCOCET/ROXICET) 5-325 MG tablet Take 1-2 tablets by mouth every 4 (four) hours as needed for severe pain. Patient not taking: Reported on 12/29/2017 09/30/16   Horton, Barbette Hair, MD  pseudoephedrine-acetaminophen (TYLENOL SINUS) 30-500 MG TABS tablet Take 1 tablet by mouth every 4 (four) hours as needed.    [provider]  tamsulosin (FLOMAX) 0.4 MG CAPS capsule Take 1 capsule (0.4 mg total) by mouth daily. Patient not taking: Reported on 12/29/2017 09/30/16   Horton, Barbette Hair, MD    Family History No family history on file.  Social History Social History   Tobacco Use  . Smoking status: Never Smoker  Substance Use Topics  . Alcohol use: Yes  . Drug use: Not on file     Allergies   Codone [hydrocodone]   Review of Systems Review of Systems  All other systems reviewed and are negative.    Physical Exam Updated Vital Signs BP (!) 147/83   Pulse 72   Resp 12   SpO2 96%   Physical Exam  Nursing note and vitals reviewed.  47 year old male, resting comfortably and in no acute distress. Vital signs are significant for mildly elevated  systolic blood pressure. Oxygen saturation is 96%, which is normal. Head is normocephalic and atraumatic. PERRLA, EOMI. Oropharynx is clear. Neck is nontender and supple without adenopathy or JVD.  There is mild tenderness to palpation over the insertion of the left paracervical muscles. Back is nontender and there is no CVA tenderness. Lungs are clear without rales, wheezes, or rhonchi. Chest is nontender. Heart has regular rate and rhythm without murmur. Abdomen is soft, flat, nontender without masses or hepatosplenomegaly and peristalsis is normoactive. Extremities have no cyanosis or  edema, full range of motion is present. Skin is warm and dry without rash. Neurologic: Mental status is normal, cranial nerves are intact, there are no motor or sensory deficits.  ED Treatments / Results  Labs (all labs ordered are listed, but only abnormal results are displayed) Labs Reviewed  CBC WITH DIFFERENTIAL/PLATELET - Abnormal; Notable for the following components:      Result Value   WBC 12.3 (*)    Neutro Abs 8.2 (*)    All other components within normal limits  COMPREHENSIVE METABOLIC PANEL - Abnormal; Notable for the following components:   Glucose, Bld 103 (*)    ALT 64 (*)    Total Bilirubin 1.4 (*)    All other components within normal limits  TROPONIN I  ETHANOL  TROPONIN I    EKG EKG Interpretation  Date/Time:  Thursday June 16 2018 01:34:06 EDT Ventricular Rate:  100 PR Interval:  158 QRS Duration: 72 QT Interval:  324 QTC Calculation: 417 R Axis:   -92 Text Interpretation:  Normal sinus rhythm Possible Left atrial enlargement Right superior axis deviation Right ventricular hypertrophy Abnormal ECG No old tracing to compare Confirmed by Delora Fuel ([id number]) on 06/16/2018 3:14:49 AM   Radiology Dg Chest 2 View  Result Date: 06/16/2018 CLINICAL DATA:  Chest pain, shortness of breath and left-sided headache EXAM: CHEST - 2 VIEW COMPARISON:  01/13/2016 FINDINGS: The heart size and mediastinal contours are within normal limits. Both lungs are clear. The visualized skeletal structures are unremarkable. IMPRESSION: No active cardiopulmonary disease. Electronically Signed   By: Ulyses Jarred M.D.   On: 06/16/2018 04:01    Procedures Procedures   Medications Ordered in ED Medications  sodium chloride 0.9 % bolus 1,000 mL (has no administration in time range)  metoCLOPramide (REGLAN) injection 10 mg (has no administration in time range)  diphenhydrAMINE (BENADRYL) injection 25 mg (has no administration in time range)  aspirin chewable tablet 324 mg (has no  administration in time range)     Initial Impression / Assessment and Plan / ED Course  I have reviewed the triage vital signs and the nursing notes.  Pertinent labs & imaging results that were available during my care of the patient were reviewed by me and considered in my medical decision making (see chart for details).  Headache which appears to be muscle contraction headache.  No red flags to suggest serious conditions such as subarachnoid hemorrhage.  No neurologic findings to suggest stroke.  Chest pain which is most likely related to cocaine use.  Heart score is 2, which puts him at low risk for major adverse cardiac events in the next 6 weeks.  Old records are reviewed, and he has no relevant past visits.  Screening labs are obtained.  ECG shows no acute changes.  He will be given aspirin and also a headache cocktail of normal saline, metoclopramide, diphenhydramine.  He feels much better after above-noted treatment.  Chest x-ray is unremarkable and initial  troponin is normal.  AST is slightly elevated, unchanged from baseline.  Initial troponin is normal.  Repeat troponin pending.  Case is signed out to Dr Wilson Singer to evaluate repeat troponin.  If negative, anticipate sending home with outpatient resources for substance abuse..  Final Clinical Impressions(s) / ED Diagnoses   Final diagnoses:  Nonspecific chest pain  Alcohol abuse  Cocaine abuse [facility])    ED Discharge Orders    None       Delora Fuel, MD 81/44/81 [phone]

## 2018-06-16 NOTE — ED Notes (Signed)
Patient transported to X-ray 

## 2018-10-10 DIAGNOSIS — Z125 Encounter for screening for malignant neoplasm of prostate: Secondary | ICD-10-CM | POA: Diagnosis not present

## 2018-10-10 DIAGNOSIS — R748 Abnormal levels of other serum enzymes: Secondary | ICD-10-CM | POA: Diagnosis not present

## 2018-10-10 DIAGNOSIS — E782 Mixed hyperlipidemia: Secondary | ICD-10-CM | POA: Diagnosis not present

## 2018-10-10 DIAGNOSIS — Z0000 Encounter for general adult medical examination without abnormal findings: Secondary | ICD-10-CM | POA: Diagnosis not present

## 2018-10-10 DIAGNOSIS — R7309 Other abnormal glucose: Secondary | ICD-10-CM | POA: Diagnosis not present

## 2018-10-17 DIAGNOSIS — K219 Gastro-esophageal reflux disease without esophagitis: Secondary | ICD-10-CM | POA: Diagnosis not present

## 2018-10-17 DIAGNOSIS — E782 Mixed hyperlipidemia: Secondary | ICD-10-CM | POA: Diagnosis not present

## 2018-10-17 DIAGNOSIS — M545 Low back pain: Secondary | ICD-10-CM | POA: Diagnosis not present

## 2018-10-17 DIAGNOSIS — R5382 Chronic fatigue, unspecified: Secondary | ICD-10-CM | POA: Diagnosis not present

## 2018-10-17 DIAGNOSIS — Z23 Encounter for immunization: Secondary | ICD-10-CM | POA: Diagnosis not present

## 2018-10-17 DIAGNOSIS — R748 Abnormal levels of other serum enzymes: Secondary | ICD-10-CM | POA: Diagnosis not present

## 2018-10-17 DIAGNOSIS — K429 Umbilical hernia without obstruction or gangrene: Secondary | ICD-10-CM | POA: Diagnosis not present

## 2018-10-17 DIAGNOSIS — Z0000 Encounter for general adult medical examination without abnormal findings: Secondary | ICD-10-CM | POA: Diagnosis not present

## 2018-10-17 DIAGNOSIS — R7309 Other abnormal glucose: Secondary | ICD-10-CM | POA: Diagnosis not present

## 2018-10-17 DIAGNOSIS — F419 Anxiety disorder, unspecified: Secondary | ICD-10-CM | POA: Diagnosis not present

## 2018-11-15 DIAGNOSIS — M2550 Pain in unspecified joint: Secondary | ICD-10-CM | POA: Diagnosis not present

## 2018-11-15 DIAGNOSIS — M1990 Unspecified osteoarthritis, unspecified site: Secondary | ICD-10-CM | POA: Diagnosis not present

## 2018-11-15 DIAGNOSIS — R748 Abnormal levels of other serum enzymes: Secondary | ICD-10-CM | POA: Diagnosis not present

## 2018-11-15 DIAGNOSIS — M79673 Pain in unspecified foot: Secondary | ICD-10-CM | POA: Diagnosis not present

## 2018-11-15 DIAGNOSIS — M79643 Pain in unspecified hand: Secondary | ICD-10-CM | POA: Diagnosis not present

## 2018-11-15 DIAGNOSIS — E669 Obesity, unspecified: Secondary | ICD-10-CM | POA: Diagnosis not present

## 2018-11-15 DIAGNOSIS — R0683 Snoring: Secondary | ICD-10-CM | POA: Diagnosis not present

## 2018-11-15 DIAGNOSIS — M549 Dorsalgia, unspecified: Secondary | ICD-10-CM | POA: Diagnosis not present

## 2018-11-15 DIAGNOSIS — R21 Rash and other nonspecific skin eruption: Secondary | ICD-10-CM | POA: Diagnosis not present

## 2018-11-25 DIAGNOSIS — E669 Obesity, unspecified: Secondary | ICD-10-CM | POA: Diagnosis not present

## 2018-11-25 DIAGNOSIS — K429 Umbilical hernia without obstruction or gangrene: Secondary | ICD-10-CM | POA: Diagnosis not present

## 2018-12-28 ENCOUNTER — Ambulatory Visit: Payer: Self-pay | Admitting: General Surgery

## 2018-12-28 NOTE — H&P (Signed)
Brian Pierce Documented: 11/25/2018 2:45 PM Location: [facility] Patient #: [id number] DOB: 1971-03-23 Married / Language: [name] / Race: [place] Male   History of Present Illness Brian Hiss M. Zohar Laing MD; 11/25/2018 3:11 PM) The patient is a 48 year old male who presents with an umbilical hernia. He is referred by Dr Shelia Media for evaluation of umbilical hernia. He states that he has had a umbilical bulge for at least 12-15 years and it has really never bothered him. He believes that it has doubled in size over this past 10-12 years but most recently he had an episode of discomfort at the umbilical area. He states it is always been cushy and soft to touch. But about a month ago the area became hard at that area and somewhat tender and firm and he could not push it back in but it subsequently became soft again. He denies any nausea, vomiting, diarrhea or constipation. He quit smoking 3 months ago. No prior abdominal surgery other than bilateral inguinal hernia repairs at age 27. He does take medicine for reflux. He denies any angina but occasionally may have some minor chest discomfort but he has no sweating, nausea, jaw or left upper arm pain with   Problem List/Past Medical Brian Hiss M. Redmond Pulling, MD; 9/38/1829 9:37 PM) UMBILICAL HERNIA WITHOUT OBSTRUCTION OR GANGRENE (K42.9)  OBESITY (BMI 30-39.9) (E66.9)   Allergies Emeline Gins, CMA; 11/25/2018 2:45 PM) No Known Drug Allergies [11/25/2018]: Allergies Reconciled   Medication History Emeline Gins, CMA; 11/25/2018 2:46 PM) Omeprazole (20MG  Capsule DR, Oral) Active. Medications Reconciled  Social History Emeline Gins, [place]; 11/25/2018 2:45 PM) Alcohol use  Moderate alcohol use. Caffeine use  Carbonated beverages, Coffee. Illicit drug use  Prefer to discuss with provider. Tobacco use  Former smoker.  Family History Emeline Gins, [place]; 11/25/2018 2:45 PM) Alcohol Abuse  Father. Diabetes Mellitus   Father. Prostate Cancer  Father.  Other Problems Brian Hiss M. Redmond Pulling, MD; 11/25/2018 3:11 PM) Arthritis  Hypercholesterolemia  Umbilical Hernia Repair     Review of Systems Brian Hiss M. Keiry Kowal MD; 11/25/2018 3:09 PM) All other systems negative  Vitals Emeline Gins CMA; 11/25/2018 2:45 PM) 11/25/2018 2:45 PM Weight: 261 lb Height: 72in Body Surface Area: 2.39 m Body Mass Index: 35.4 kg/m  Temp.: 97.37F  Pulse: 79 (Regular)  BP: 122/76 (Sitting, Left Arm, Standard)       Physical Exam Brian Hiss M. Samie Reasons MD; 11/25/2018 3:09 PM) General Mental Status-Alert. General Appearance-Consistent with stated age. Hydration-Well hydrated. Voice-Normal.  Head and Neck Head-normocephalic, atraumatic with no lesions or palpable masses. Trachea-midline. Thyroid Gland Characteristics - normal size and consistency.  Eye Eyeball - Bilateral-Extraocular movements intact. Sclera/Conjunctiva - Bilateral-No scleral icterus.  ENMT Ears -Note: nml ext ears.  Mouth and Throat -Note: lips intact.   Chest and Lung Exam Chest and lung exam reveals -quiet, even and easy respiratory effort with no use of accessory muscles and on auscultation, normal breath sounds, no adventitious sounds and normal vocal resonance. Inspection Chest Wall - Normal. Back - normal.  Breast - Did not examine.  Cardiovascular Cardiovascular examination reveals -normal heart sounds, regular rate and rhythm with no murmurs and normal pedal pulses bilaterally.  Abdomen Inspection  Inspection of the abdomen reveals: Note: obvious umbilical bulge. soft, nt, reducible, about 2 x 2cm. Skin - Scar - no surgical scars. Palpation/Percussion Palpation and Percussion of the abdomen reveal - Soft, Non Tender, No Rebound tenderness, No Rigidity (guarding) and No hepatosplenomegaly. Auscultation Auscultation of the abdomen reveals - Bowel sounds normal.  Peripheral Vascular Upper  Extremity Palpation - Pulses bilaterally normal.  Neurologic Neurologic evaluation reveals -alert and oriented x 3 with no impairment of recent or remote memory. Mental Status-Normal.  Neuropsychiatric The patient's mood and affect are described as -normal. Judgment and Insight-insight is appropriate concerning matters relevant to self.  Musculoskeletal Normal Exam - Left-Upper Extremity Strength Normal and Lower Extremity Strength Normal. Normal Exam - Right-Upper Extremity Strength Normal and Lower Extremity Strength Normal.  Lymphatic Head & Neck  General Head & Neck Lymphatics: Bilateral - Description - Normal. Axillary - Did not examine. Femoral & Inguinal - Did not examine.    Assessment & Plan Brian Hiss M. Grady Lucci MD; 2/77/4128 7:86 PM) UMBILICAL HERNIA WITHOUT OBSTRUCTION OR GANGRENE (K42.9) Impression: We discussed the etiology of ventral umbilical hernias. We discussed the signs and symptoms of incarceration and strangulation. The patient was given educational material. I also drew diagrams.  We discussed nonoperative and operative management. With respect to operative management, we discussed both open repair and laparoscopic repair. We discussed the pros and cons of each approach. I discussed the typical aftercare with each procedure and how each procedure differs.  I recommended a Laparoscopic assisted repair of umbilical hernia with mesh (primary repair with mesh underlay laparoscopically)  The patient has elected to proceed with surgery  We discussed the risk and benefits of surgery including but not limited to bleeding, infection, injury to surrounding structures, hernia recurrence, mesh complications, hematoma/seroma formation, need to convert to an open procedure, blood clot formation, urinary retention, post operative ileus, general anesthesia risk, long-term abdominal pain. We discussed that this procedure can be quite uncomfortable and difficult to  recover from based on how the mesh is secured to the abdominal wall. We discussed the importance of avoiding heavy lifting and straining for a period of 6 weeks. Current Plans Pt Education - Pamphlet Given - Hernia Surgery: discussed with patient and provided information. OBESITY (BMI 30-39.9) (E66.9) Impression: His BMI increases his risk for recurrent slightly but it is not risk prohibitive at the BMI of around 35.  Leighton Ruff. Redmond Pulling, MD, FACS General, Bariatric, & Minimally Invasive Surgery [facility], [place]

## 2018-12-28 NOTE — H&P (View-Only) (Signed)
Brian Pierce Documented: 11/25/2018 2:45 PM Location: [facility] Patient #: [id number] DOB: 08/06/1971 Married / Language: Brian Pierce / Race: Dhingra Male   History of Present Illness Brian Pierce; 11/25/2018 3:11 PM) The patient is a 48 year old male who presents with an umbilical hernia. He is referred by Dr Brian Pierce for evaluation of umbilical hernia. He states that he has had a umbilical bulge for at least 12-15 years and it has really never bothered him. He believes that it has doubled in size over this past 10-12 years but most recently he had an episode of discomfort at the umbilical area. He states it is always been cushy and soft to touch. But about a month ago the area became hard at that area and somewhat tender and firm and he could not push it back in but it subsequently became soft again. He denies any nausea, vomiting, diarrhea or constipation. He quit smoking 3 months ago. No prior abdominal surgery other than bilateral inguinal hernia repairs at age 2. He does take medicine for reflux. He denies any angina but occasionally may have some minor chest discomfort but he has no sweating, nausea, jaw or left upper arm pain with   Problem List/Past Medical Brian Hiss M. Redmond Pulling, Pierce; 5/99/3570 1:77 PM) UMBILICAL HERNIA WITHOUT OBSTRUCTION OR GANGRENE (K42.9)  OBESITY (BMI 30-39.9) (E66.9)   Allergies Brian Pierce, CMA; 11/25/2018 2:45 PM) No Known Drug Allergies [11/25/2018]: Allergies Reconciled   Medication History Brian Pierce, CMA; 11/25/2018 2:46 PM) Omeprazole (20MG  Capsule DR, Oral) Active. Medications Reconciled  Social History Brian Pierce, [place]; 11/25/2018 2:45 PM) Alcohol use  Moderate alcohol use. Caffeine use  Carbonated beverages, Coffee. Illicit drug use  Prefer to discuss with provider. Tobacco use  Former smoker.  Family History Brian Pierce, [place]; 11/25/2018 2:45 PM) Alcohol Abuse  Father. Diabetes Mellitus   Father. Prostate Cancer  Father.  Other Problems Brian Hiss M. Redmond Pulling, Pierce; 11/25/2018 3:11 PM) Arthritis  Hypercholesterolemia  Umbilical Hernia Repair     Review of Systems Brian Hiss M. Ayub Kirsh Pierce; 11/25/2018 3:09 PM) All other systems negative  Vitals Brian Pierce CMA; 11/25/2018 2:45 PM) 11/25/2018 2:45 PM Weight: 261 lb Height: 72in Body Surface Area: 2.39 m Body Mass Index: 35.4 kg/m  Temp.: 97.58F  Pulse: 79 (Regular)  BP: 122/76 (Sitting, Left Arm, Standard)       Physical Exam Brian Hiss M. Aryn Kops Pierce; 11/25/2018 3:09 PM) General Mental Status-Alert. General Appearance-Consistent with stated age. Hydration-Well hydrated. Voice-Normal.  Head and Neck Head-normocephalic, atraumatic with no lesions or palpable masses. Trachea-midline. Thyroid Gland Characteristics - normal size and consistency.  Eye Eyeball - Bilateral-Extraocular movements intact. Sclera/Conjunctiva - Bilateral-No scleral icterus.  ENMT Ears -Note: nml ext ears.  Mouth and Throat -Note: lips intact.   Chest and Lung Exam Chest and lung exam reveals -quiet, even and easy respiratory effort with no use of accessory muscles and on auscultation, normal breath sounds, no adventitious sounds and normal vocal resonance. Inspection Chest Wall - Normal. Back - normal.  Breast - Did not examine.  Cardiovascular Cardiovascular examination reveals -normal heart sounds, regular rate and rhythm with no murmurs and normal pedal pulses bilaterally.  Abdomen Inspection  Inspection of the abdomen reveals: Note: obvious umbilical bulge. soft, nt, reducible, about 2 x 2cm. Skin - Scar - no surgical scars. Palpation/Percussion Palpation and Percussion of the abdomen reveal - Soft, Non Tender, No Rebound tenderness, No Rigidity (guarding) and No hepatosplenomegaly. Auscultation Auscultation of the abdomen reveals - Bowel sounds normal.  Peripheral Vascular Upper  Extremity Palpation - Pulses bilaterally normal.  Neurologic Neurologic evaluation reveals -alert and oriented x 3 with no impairment of recent or remote memory. Mental Status-Normal.  Neuropsychiatric The patient's mood and affect are described as -normal. Judgment and Insight-insight is appropriate concerning matters relevant to self.  Musculoskeletal Normal Exam - Left-Upper Extremity Strength Normal and Lower Extremity Strength Normal. Normal Exam - Right-Upper Extremity Strength Normal and Lower Extremity Strength Normal.  Lymphatic Head & Neck  General Head & Neck Lymphatics: Bilateral - Description - Normal. Axillary - Did not examine. Femoral & Inguinal - Did not examine.    Assessment & Plan Brian Hiss M. Donise Woodle Pierce; 0/76/2263 3:35 PM) UMBILICAL HERNIA WITHOUT OBSTRUCTION OR GANGRENE (K42.9) Impression: We discussed the etiology of ventral umbilical hernias. We discussed the signs and symptoms of incarceration and strangulation. The patient was given educational material. I also drew diagrams.  We discussed nonoperative and operative management. With respect to operative management, we discussed both open repair and laparoscopic repair. We discussed the pros and cons of each approach. I discussed the typical aftercare with each procedure and how each procedure differs.  I recommended a Laparoscopic assisted repair of umbilical hernia with mesh (primary repair with mesh underlay laparoscopically)  The patient has elected to proceed with surgery  We discussed the risk and benefits of surgery including but not limited to bleeding, infection, injury to surrounding structures, hernia recurrence, mesh complications, hematoma/seroma formation, need to convert to an open procedure, blood clot formation, urinary retention, post operative ileus, general anesthesia risk, long-term abdominal pain. We discussed that this procedure can be quite uncomfortable and difficult to  recover from based on how the mesh is secured to the abdominal wall. We discussed the importance of avoiding heavy lifting and straining for a period of 6 weeks. Current Plans Pt Education - Pamphlet Given - Hernia Surgery: discussed with patient and provided information. OBESITY (BMI 30-39.9) (E66.9) Impression: His BMI increases his risk for recurrent slightly but it is not risk prohibitive at the BMI of around 35.  Leighton Ruff. Redmond Pulling, Pierce, FACS General, Bariatric, & Minimally Invasive Surgery [facility], [place]

## 2019-01-03 NOTE — Patient Instructions (Addendum)
KALANI STHILAIRE    Your procedure is scheduled on: 01-10-2019  Report to [facility] Main  Entrance  Report to admitting at 830 AM    Call this number if you have problems the morning of surgery [phone]    Remember:  St[place], NO [place].  NO SOLID FOOD AFTER MIDNIGHT THE NIGHT PRIOR TO SURGERY. NOTHING BY MOUTH EXCEPT CLEAR LIQUIDS UNTIL 3 HOURS PRIOR TO [place] SURGERY. PLEASE FINISH ENSURE DRINK PER SURGEON ORDER 3 HOURS PRIOR TO SCHEDULED SURGERY TIME WHICH NEEDS TO BE COMPLETED AT 730 AM.    CLEAR LIQUID DIET   Foods Allowed                                                                     Foods Excluded  Coffee and tea, regular and decaf                             liquids that you cannot  Plain Jell-O in any flavor                                             see through such as: Fruit ices (not with fruit pulp)                                     milk, soups, orange juice  Iced Popsicles                                    All solid food Carbonated beverages, regular and diet                                    Cranberry, grape and apple juices Sports drinks like Gatorade Lightly seasoned clear broth or consume(fat free) Sugar, honey syrup  Sample Menu Breakfast                                Lunch                                     Supper Cranberry juice                    Beef broth                            Chicken broth Jell-O  Grape juice                           Apple juice Coffee or tea                        Jell-O                                      Popsicle                                                Coffee or tea                        Coffee or tea  _____________________________________________________________________    Take these medicines the morning of surgery with A SIP OF WATER: OMEPRPAZOLE (PRILOSEC)                    You may not have any metal on your body including hair pins and              piercings  Do not wear jewelry, make-up, lotions, powders or perfumes, deodorant             Do not wear nail polish.  Do not shave  48 hours prior to surgery.              Men may shave face and neck.   Do not bring valuables to the hospital. [place].  Contacts, dentures or bridgework may not be worn into surgery.  Leave suitcase in the car. After surgery it may be brought to your room.     Patients discharged the day of surgery will not be allowed to drive home. IF YOU ARE HAVING SURGERY AND GOING HOME THE SAME DAY, YOU MUST HAVE AN ADULT TO DRIVE YOU HOME AND BE WITH YOU FOR 24 HOURS. YOU MAY GO HOME BY TAXI OR UBER OR ORTHERWISE, BUT AN ADULT MUST ACCOMPANY YOU HOME AND STAY WITH YOU FOR 24 HOURS.  Name and phone number of your driver: catherine wife [phone]  __________  [facility] - Preparing for Surgery Before surgery, you can play an important role.  Because skin is not sterile, your skin needs to be as free of germs as possible.  You can reduce the number of germs on your skin by washing with CHG (chlorahexidine gluconate) soap before surgery.  CHG is an antiseptic cleaner which kills germs and bonds with the skin to continue killing germs even after washing. Please DO NOT use if you have an allergy to CHG or antibacterial soaps.  If your skin becomes reddened/irritated stop using the CHG and inform your nurse when you arrive at Short Stay. Do not shave (including legs and underarms) for at least 48 hours prior to the first CHG shower.  You may shave your face/neck. Please follow these instructions carefully:  1.  Shower with CHG Soap the night before surgery and the  morning of Surgery.  2.  If you choose to wash your hair, wash your hair first as usual with  your  normal  shampoo.  3.  After you shampoo, rinse your hair and body  thoroughly to remove the  shampoo.                           4.  Use CHG as you would any other liquid soap.  You can apply chg directly  to the skin and wash                       Gently with a scrungie or clean washcloth.  5.  Apply the CHG Soap to your body ONLY FROM THE NECK DOWN.   Do not use on face/ open                           Wound or open sores. Avoid contact with eyes, ears mouth and genitals (private parts).                       Wash face,  Genitals (private parts) with your normal soap.             6.  Wash thoroughly, paying special attention to the area where your surgery  will be performed.  7.  Thoroughly rinse your body with warm water from the neck down.  8.  DO NOT shower/wash with your normal soap after using and rinsing off  the CHG Soap.                9.  Pat yourself dry with a clean towel.            10.  Wear clean pajamas.            11.  Place clean sheets on your bed the night of your first shower and do not  sleep with pets. Day of Surgery : Do not apply any lotions/deodorants the morning of surgery.  Please wear clean clothes to the hospital/surgery center.  FAILURE TO FOLLOW THESE INSTRUCTIONS MAY RESULT IN THE CANCELLATION OF YOUR SURGERY PATIENT SIGNATURE_________________________________  NURSE SIGNATURE__________________________________  ________________________________________________________________________

## 2019-01-03 NOTE — Progress Notes (Signed)
EKG 06-16-18 Epic CHEST XRAY 06-16-18 EPIC

## 2019-01-05 ENCOUNTER — Other Ambulatory Visit: Payer: Self-pay

## 2019-01-05 ENCOUNTER — Encounter (HOSPITAL_COMMUNITY)
Admission: RE | Admit: 2019-01-05 | Discharge: 2019-01-05 | Disposition: A | Discharge status: Home / Self Care | Payer: 59 | Source: Ambulatory Visit | Attending: General Surgery | Admitting: General Surgery

## 2019-01-05 ENCOUNTER — Encounter (HOSPITAL_COMMUNITY): Payer: Self-pay

## 2019-01-05 DIAGNOSIS — Z01812 Encounter for preprocedural laboratory examination: Secondary | ICD-10-CM | POA: Diagnosis not present

## 2019-01-05 HISTORY — DX: Umbilical hernia without obstruction or gangrene: K42.9

## 2019-01-05 HISTORY — DX: Unspecified osteoarthritis, unspecified site: M19.90

## 2019-01-05 HISTORY — DX: Personal history of urinary calculi: Z87.442

## 2019-01-05 HISTORY — DX: Dizziness and giddiness: R42

## 2019-01-05 HISTORY — DX: Gastro-esophageal reflux disease without esophagitis: K21.9

## 2019-01-05 HISTORY — DX: Dorsalgia, unspecified: M54.9

## 2019-01-05 HISTORY — DX: Abnormal levels of other serum enzymes: R74.8

## 2019-01-05 LAB — CBC
HEMATOCRIT: 48.2 % (ref 39.0–52.0)
HEMOGLOBIN: 15.8 g/dL (ref 13.0–17.0)
MCH: 30.6 pg (ref 26.0–34.0)
MCHC: 32.8 g/dL (ref 30.0–36.0)
MCV: 93.2 fL (ref 80.0–100.0)
NRBC: 0 % (ref 0.0–0.2)
Platelets: 187 10*3/uL (ref 150–400)
RBC: 5.17 MIL/uL (ref 4.22–5.81)
RDW: 12 % (ref 11.5–15.5)
WBC: 7.5 10*3/uL (ref 4.0–10.5)

## 2019-01-05 LAB — COMPREHENSIVE METABOLIC PANEL
ALK PHOS: 103 U/L (ref 38–126)
ALT: 73 U/L — ABNORMAL HIGH (ref 0–44)
AST: 40 U/L (ref 15–41)
Albumin: 4.3 g/dL (ref 3.5–5.0)
Anion gap: 8 (ref 5–15)
BILIRUBIN TOTAL: 0.9 mg/dL (ref 0.3–1.2)
BUN: 24 mg/dL — AB (ref 6–20)
CO2: 25 mmol/L (ref 22–32)
CREATININE: 0.9 mg/dL (ref 0.61–1.24)
Calcium: 9.3 mg/dL (ref 8.9–10.3)
Chloride: 107 mmol/L (ref 98–111)
GFR calc Af Amer: >60 mL/min (ref >60)
Glucose, Bld: 142 mg/dL — ABNORMAL HIGH (ref 70–99)
POTASSIUM: 4 mmol/L (ref 3.5–5.1)
Sodium: 140 mmol/L (ref 135–145)
TOTAL PROTEIN: 7.5 g/dL (ref 6.5–8.1)

## 2019-01-06 NOTE — Progress Notes (Signed)
cmet results faxed to dr Greer Pickerel epic inbasket

## 2019-01-10 ENCOUNTER — Other Ambulatory Visit: Payer: Self-pay

## 2019-01-10 ENCOUNTER — Ambulatory Visit (HOSPITAL_COMMUNITY): Payer: 59 | Admitting: Physician Assistant

## 2019-01-10 ENCOUNTER — Encounter (HOSPITAL_COMMUNITY): Payer: Self-pay

## 2019-01-10 ENCOUNTER — Ambulatory Visit (HOSPITAL_COMMUNITY)
Admission: RE | Admit: 2019-01-10 | Discharge: 2019-01-10 | Disposition: A | Discharge status: Home / Self Care | Payer: 59 | Attending: General Surgery | Admitting: General Surgery

## 2019-01-10 ENCOUNTER — Ambulatory Visit (HOSPITAL_COMMUNITY): Payer: 59 | Admitting: Anesthesiology

## 2019-01-10 ENCOUNTER — Encounter (HOSPITAL_COMMUNITY)
Admission: RE | Disposition: A | Discharge status: Home / Self Care | Payer: Self-pay | Source: Home / Self Care | Attending: General Surgery

## 2019-01-10 DIAGNOSIS — E7800 Pure hypercholesterolemia, unspecified: Secondary | ICD-10-CM | POA: Diagnosis not present

## 2019-01-10 DIAGNOSIS — Z79899 Other long term (current) drug therapy: Secondary | ICD-10-CM | POA: Diagnosis not present

## 2019-01-10 DIAGNOSIS — M1990 Unspecified osteoarthritis, unspecified site: Secondary | ICD-10-CM | POA: Diagnosis not present

## 2019-01-10 DIAGNOSIS — K429 Umbilical hernia without obstruction or gangrene: Secondary | ICD-10-CM | POA: Insufficient documentation

## 2019-01-10 DIAGNOSIS — K420 Umbilical hernia with obstruction, without gangrene: Secondary | ICD-10-CM | POA: Diagnosis not present

## 2019-01-10 DIAGNOSIS — Z811 Family history of alcohol abuse and dependence: Secondary | ICD-10-CM | POA: Insufficient documentation

## 2019-01-10 DIAGNOSIS — Z8042 Family history of malignant neoplasm of prostate: Secondary | ICD-10-CM | POA: Insufficient documentation

## 2019-01-10 DIAGNOSIS — Z833 Family history of diabetes mellitus: Secondary | ICD-10-CM | POA: Diagnosis not present

## 2019-01-10 DIAGNOSIS — Z6835 Body mass index (BMI) 35.0-35.9, adult: Secondary | ICD-10-CM | POA: Insufficient documentation

## 2019-01-10 DIAGNOSIS — Z09 Encounter for follow-up examination after completed treatment for conditions other than malignant neoplasm: Secondary | ICD-10-CM | POA: Insufficient documentation

## 2019-01-10 DIAGNOSIS — Z87891 Personal history of nicotine dependence: Secondary | ICD-10-CM | POA: Diagnosis not present

## 2019-01-10 DIAGNOSIS — E669 Obesity, unspecified: Secondary | ICD-10-CM | POA: Insufficient documentation

## 2019-01-10 HISTORY — PX: UMBILICAL HERNIA REPAIR: SHX196

## 2019-01-10 SURGERY — REPAIR, HERNIA, UMBILICAL, LAPAROSCOPIC
Anesthesia: General

## 2019-01-10 MED ORDER — PROPOFOL 10 MG/ML IV BOLUS
INTRAVENOUS | Status: DC | PRN
  Administered 2019-01-10: 200 mg via INTRAVENOUS

## 2019-01-10 MED ORDER — LACTATED RINGERS IV SOLN
INTRAVENOUS | Status: DC
  Administered 2019-01-10 (×2): via INTRAVENOUS

## 2019-01-10 MED ORDER — ENSURE PRE-SURGERY PO LIQD
296.0000 mL | Freq: Once | ORAL | Status: DC
  Filled 2019-01-10: qty 296

## 2019-01-10 MED ORDER — LIDOCAINE 2% (20 MG/ML) 5 ML SYRINGE
INTRAMUSCULAR | Status: DC | PRN
  Administered 2019-01-10: 80 mg via INTRAVENOUS

## 2019-01-10 MED ORDER — MIDAZOLAM HCL 5 MG/5ML IJ SOLN
INTRAMUSCULAR | Status: DC | PRN
  Administered 2019-01-10: 2 mg via INTRAVENOUS

## 2019-01-10 MED ORDER — ROCURONIUM BROMIDE 10 MG/ML (PF) SYRINGE
PREFILLED_SYRINGE | INTRAVENOUS | Status: DC | PRN
  Administered 2019-01-10: 50 mg via INTRAVENOUS
  Administered 2019-01-10: 20 mg via INTRAVENOUS

## 2019-01-10 MED ORDER — SUGAMMADEX SODIUM 200 MG/2ML IV SOLN
INTRAVENOUS | Status: AC
  Filled 2019-01-10: qty 2

## 2019-01-10 MED ORDER — PROPOFOL 10 MG/ML IV BOLUS
INTRAVENOUS | Status: AC
  Filled 2019-01-10: qty 20

## 2019-01-10 MED ORDER — EPHEDRINE SULFATE-NACL 50-0.9 MG/10ML-% IV SOSY
PREFILLED_SYRINGE | INTRAVENOUS | Status: DC | PRN
  Administered 2019-01-10: 10 mg via INTRAVENOUS

## 2019-01-10 MED ORDER — EPHEDRINE 5 MG/ML INJ
INTRAVENOUS | Status: AC
  Filled 2019-01-10: qty 10

## 2019-01-10 MED ORDER — DEXAMETHASONE SODIUM PHOSPHATE 10 MG/ML IJ SOLN
INTRAMUSCULAR | Status: AC
  Filled 2019-01-10: qty 1

## 2019-01-10 MED ORDER — KETAMINE HCL 10 MG/ML IJ SOLN
INTRAMUSCULAR | Status: DC | PRN
  Administered 2019-01-10: 40 mg via INTRAVENOUS

## 2019-01-10 MED ORDER — DEXAMETHASONE SODIUM PHOSPHATE 10 MG/ML IJ SOLN
INTRAMUSCULAR | Status: DC | PRN
  Administered 2019-01-10: 8 mg via INTRAVENOUS

## 2019-01-10 MED ORDER — GABAPENTIN 300 MG PO CAPS
300.0000 mg | ORAL_CAPSULE | ORAL | Status: AC
  Administered 2019-01-10: 300 mg via ORAL
  Filled 2019-01-10: qty 1

## 2019-01-10 MED ORDER — BUPIVACAINE HCL (PF) 0.25 % IJ SOLN
INTRAMUSCULAR | Status: AC
  Filled 2019-01-10: qty 30

## 2019-01-10 MED ORDER — ACETAMINOPHEN 500 MG PO TABS
1000.0000 mg | ORAL_TABLET | ORAL | Status: AC
  Administered 2019-01-10: 1000 mg via ORAL
  Filled 2019-01-10: qty 2

## 2019-01-10 MED ORDER — ACETAMINOPHEN 325 MG PO TABS: 650.0000 mg | ORAL_TABLET | Freq: Four times a day (QID) | ORAL | 0 refills | Status: AC

## 2019-01-10 MED ORDER — KETOROLAC TROMETHAMINE 30 MG/ML IJ SOLN
INTRAMUSCULAR | Status: DC | PRN
  Administered 2019-01-10: 30 mg via INTRAVENOUS

## 2019-01-10 MED ORDER — PHENYLEPHRINE 40 MCG/ML (10ML) SYRINGE FOR IV PUSH (FOR BLOOD PRESSURE SUPPORT)
PREFILLED_SYRINGE | INTRAVENOUS | Status: DC | PRN
  Administered 2019-01-10: 80 ug via INTRAVENOUS

## 2019-01-10 MED ORDER — ROCURONIUM BROMIDE 100 MG/10ML IV SOLN
INTRAVENOUS | Status: AC
  Filled 2019-01-10: qty 1

## 2019-01-10 MED ORDER — CEFAZOLIN SODIUM-DEXTROSE 2-4 GM/100ML-% IV SOLN
2.0000 g | INTRAVENOUS | Status: AC
  Administered 2019-01-10: 2 g via INTRAVENOUS
  Filled 2019-01-10: qty 100

## 2019-01-10 MED ORDER — KETOROLAC TROMETHAMINE 30 MG/ML IJ SOLN
INTRAMUSCULAR | Status: AC
  Filled 2019-01-10: qty 1

## 2019-01-10 MED ORDER — SUGAMMADEX SODIUM 200 MG/2ML IV SOLN
INTRAVENOUS | Status: DC | PRN
  Administered 2019-01-10: 400 mg via INTRAVENOUS

## 2019-01-10 MED ORDER — FENTANYL CITRATE (PF) 250 MCG/5ML IJ SOLN
INTRAMUSCULAR | Status: DC | PRN
  Administered 2019-01-10 (×2): 50 ug via INTRAVENOUS

## 2019-01-10 MED ORDER — ONDANSETRON HCL 4 MG/2ML IJ SOLN
INTRAMUSCULAR | Status: DC | PRN
  Administered 2019-01-10: 4 mg via INTRAVENOUS

## 2019-01-10 MED ORDER — FENTANYL CITRATE (PF) 100 MCG/2ML IJ SOLN
25.0000 ug | INTRAMUSCULAR | Status: DC | PRN
  Administered 2019-01-10: 50 ug via INTRAVENOUS

## 2019-01-10 MED ORDER — GABAPENTIN 300 MG PO CAPS: 300.0000 mg | ORAL_CAPSULE | Freq: Two times a day (BID) | ORAL | 0 refills | Status: DC

## 2019-01-10 MED ORDER — FENTANYL CITRATE (PF) 100 MCG/2ML IJ SOLN
INTRAMUSCULAR | Status: AC
  Filled 2019-01-10: qty 2

## 2019-01-10 MED ORDER — CHLORHEXIDINE GLUCONATE CLOTH 2 % EX PADS: 6.0000 | MEDICATED_PAD | Freq: Once | CUTANEOUS | Status: DC

## 2019-01-10 MED ORDER — ONDANSETRON HCL 4 MG/2ML IJ SOLN
INTRAMUSCULAR | Status: AC
  Filled 2019-01-10: qty 2

## 2019-01-10 MED ORDER — BUPIVACAINE-EPINEPHRINE (PF) 0.25% -1:200000 IJ SOLN: 30.0000 mL | Freq: Once | INTRAMUSCULAR | Status: DC

## 2019-01-10 MED ORDER — BUPIVACAINE-EPINEPHRINE (PF) 0.25% -1:200000 IJ SOLN
50.0000 mL | Freq: Once | INTRAMUSCULAR | Status: AC
  Administered 2019-01-10: 50 mL
  Filled 2019-01-10: qty 60

## 2019-01-10 MED ORDER — ONDANSETRON HCL 4 MG/2ML IJ SOLN: 4.0000 mg | Freq: Once | INTRAMUSCULAR | Status: DC | PRN

## 2019-01-10 MED ORDER — LIDOCAINE 2% (20 MG/ML) 5 ML SYRINGE
INTRAMUSCULAR | Status: DC | PRN
  Administered 2019-01-10: 1.5 mg/kg/h via INTRAVENOUS

## 2019-01-10 MED ORDER — OXYCODONE HCL 5 MG PO TABS: 5.0000 mg | ORAL_TABLET | Freq: Four times a day (QID) | ORAL | 0 refills | Status: DC | PRN

## 2019-01-10 MED ORDER — IBUPROFEN 600 MG PO TABS: 600.0000 mg | ORAL_TABLET | Freq: Three times a day (TID) | ORAL | 0 refills | Status: AC

## 2019-01-10 MED ORDER — LIDOCAINE 2% (20 MG/ML) 5 ML SYRINGE
INTRAMUSCULAR | Status: AC
  Filled 2019-01-10: qty 5

## 2019-01-10 MED ORDER — MIDAZOLAM HCL 2 MG/2ML IJ SOLN
INTRAMUSCULAR | Status: AC
  Filled 2019-01-10: qty 2

## 2019-01-10 MED FILL — GABAPENTIN 300 MG CAPSULE: 300 | 7 days supply | Qty: 14 | Fill #0

## 2019-01-10 MED FILL — IBUPROFEN 600 MG TABLET: 600 | 5 days supply | Qty: 15 | Fill #0

## 2019-01-10 MED FILL — oxyCODONE HCL 5 MG TABS: 5 | 5 days supply | Qty: 20 | Fill #0

## 2019-01-10 SURGICAL SUPPLY — 42 items
APPLIER CLIP 5 13 M/L LIGAMAX5 (MISCELLANEOUS)
BANDAGE ADH SHEER 1  50/CT (GAUZE/BANDAGES/DRESSINGS) ×4 IMPLANT
BENZOIN TINCTURE PRP APPL 2/3 (GAUZE/BANDAGES/DRESSINGS) ×3 IMPLANT
BINDER ABDOMINAL 12 ML 46-62 (SOFTGOODS) ×2 IMPLANT
CABLE HIGH FREQUENCY MONO STRZ (ELECTRODE) ×1 IMPLANT
CHLORAPREP W/TINT 26ML (MISCELLANEOUS) ×3 IMPLANT
CLIP APPLIE 5 13 M/L LIGAMAX5 (MISCELLANEOUS) IMPLANT
CLOSURE WOUND 1/2 X4 (GAUZE/BANDAGES/DRESSINGS) ×1
COVER SURGICAL LIGHT HANDLE (MISCELLANEOUS) ×3 IMPLANT
COVER WAND RF STERILE (DRAPES) ×2 IMPLANT
DECANTER SPIKE VIAL GLASS SM (MISCELLANEOUS) ×3 IMPLANT
DERMABOND ADVANCED (GAUZE/BANDAGES/DRESSINGS)
DERMABOND ADVANCED .7 DNX12 (GAUZE/BANDAGES/DRESSINGS) ×1 IMPLANT
DEVICE SECURE STRAP 25 ABSORB (INSTRUMENTS) ×2 IMPLANT
DEVICE TROCAR PUNCTURE CLOSURE (ENDOMECHANICALS) ×2 IMPLANT
DRSG TEGADERM 2-3/8X2-3/4 SM (GAUZE/BANDAGES/DRESSINGS) IMPLANT
DRSG TEGADERM 4X4.75 (GAUZE/BANDAGES/DRESSINGS) ×2 IMPLANT
ELECT PENCIL ROCKER SW 15FT (MISCELLANEOUS) ×2 IMPLANT
ELECT REM PT RETURN 15FT ADLT (MISCELLANEOUS) ×3 IMPLANT
GAUZE SPONGE 2X2 8PLY STRL LF (GAUZE/BANDAGES/DRESSINGS) IMPLANT
GLOVE BIO SURGEON STRL SZ7.5 (GLOVE) ×3 IMPLANT
GLOVE INDICATOR 8.0 STRL GRN (GLOVE) ×3 IMPLANT
GOWN STRL REUS W/TWL XL LVL3 (GOWN DISPOSABLE) ×5 IMPLANT
KIT BASIN OR (CUSTOM PROCEDURE TRAY) ×3 IMPLANT
L-HOOK LAP DISP 36CM (ELECTROSURGICAL) ×3
LHOOK LAP DISP 36CM (ELECTROSURGICAL) IMPLANT
MESH VENTRALIGHT ST 4.5IN (Mesh General) ×2 IMPLANT
SCISSORS LAP 5X35 DISP (ENDOMECHANICALS) ×3 IMPLANT
SET IRRIG TUBING LAPAROSCOPIC (IRRIGATION / IRRIGATOR) IMPLANT
SET TUBE SMOKE EVAC HIGH FLOW (TUBING) ×2 IMPLANT
SLEEVE XCEL OPT CAN 5 100 (ENDOMECHANICALS) ×3 IMPLANT
SPONGE GAUZE 2X2 STER 10/PKG (GAUZE/BANDAGES/DRESSINGS) ×2
STRIP CLOSURE SKIN 1/2X4 (GAUZE/BANDAGES/DRESSINGS) ×2 IMPLANT
SUT MNCRL AB 4-0 PS2 18 (SUTURE) ×3 IMPLANT
SUT NOVA NAB DX-16 0-1 5-0 T12 (SUTURE) ×4 IMPLANT
SUT NOVA NAB GS-21 0 18 T12 DT (SUTURE) IMPLANT
SUT VIC AB 3-0 SH 18 (SUTURE) ×2 IMPLANT
TOWEL OR 17X26 10 PK STRL BLUE (TOWEL DISPOSABLE) ×3 IMPLANT
TOWEL OR NON WOVEN STRL DISP B (DISPOSABLE) ×3 IMPLANT
TRAY LAPAROSCOPIC (CUSTOM PROCEDURE TRAY) ×3 IMPLANT
TROCAR BLADELESS OPT 5 100 (ENDOMECHANICALS) ×3 IMPLANT
TROCAR XCEL BLUNT TIP 100MML (ENDOMECHANICALS) ×3 IMPLANT

## 2019-01-10 NOTE — Discharge Instructions (Signed)
General Anesthesia, Adult, Care After °This sheet gives you information about how to care for yourself after your procedure. Your health care provider may also give you more specific instructions. If you have problems or questions, contact your health care provider. °What can I expect after the procedure? °After the procedure, the following side effects are common: °· Pain or discomfort at the IV site. °· Nausea. °· Vomiting. °· Sore throat. °· Trouble concentrating. °· Feeling cold or chills. °· Weak or tired. °· Sleepiness and fatigue. °· Soreness and body aches. These side effects can affect parts of the body that were not involved in surgery. °Follow these instructions at home: ° °For at least 24 hours after the procedure: °· Have a responsible adult stay with you. It is important to have someone help care for you until you are awake and alert. °· Rest as needed. °· Do not: °? Participate in activities in which you could fall or become injured. °? Drive. °? Use heavy machinery. °? Drink alcohol. °? Take sleeping pills or medicines that cause drowsiness. °? Make important decisions or sign legal documents. °? Take care of children on your own. °Eating and drinking °· Follow any instructions from your health care provider about eating or drinking restrictions. °· When you feel hungry, start by eating small amounts of foods that are soft and easy to digest (bland), such as toast. Gradually return to your regular diet. °· Drink enough fluid to keep your urine pale yellow. °· If you vomit, rehydrate by drinking water, juice, or clear broth. °General instructions °· If you have sleep apnea, surgery and certain medicines can increase your risk for breathing problems. Follow instructions from your health care provider about wearing your sleep device: °? Anytime you are sleeping, including during daytime naps. °? While taking prescription pain medicines, sleeping medicines, or medicines that make you drowsy. °· Return to  your normal activities as told by your health care provider. Ask your health care provider what activities are safe for you. °· Take over-the-counter and prescription medicines only as told by your health care provider. °· If you smoke, do not smoke without supervision. °· Keep all follow-up visits as told by your health care provider. This is important. °Contact a health care provider if: °· You have nausea or vomiting that does not get better with medicine. °· You cannot eat or drink without vomiting. °· You have pain that does not get better with medicine. °· You are unable to pass urine. °· You develop a skin rash. °· You have a fever. °· You have redness around your IV site that gets worse. °Get help right away if: °· You have difficulty breathing. °· You have chest pain. °· You have blood in your urine or stool, or you vomit blood. °Summary °· After the procedure, it is common to have a sore throat or nausea. It is also common to feel tired. °· Have a responsible adult stay with you for the first 24 hours after general anesthesia. It is important to have someone help care for you until you are awake and alert. °· When you feel hungry, start by eating small amounts of foods that are soft and easy to digest (bland), such as toast. Gradually return to your regular diet. °· Drink enough fluid to keep your urine pale yellow. °· Return to your normal activities as told by your health care provider. Ask your health care provider what activities are safe for you. °This information is not   intended to replace advice given to you by your health care provider. Make sure you discuss any questions you have with your health care provider. Document Released: 02/01/2001 Document Revised: 06/11/2017 Document Reviewed: 06/11/2017 Elsevier Interactive Patient Education  2019 [facility] LAPAROSCOPIC SURGERY: POST OP  INSTRUCTIONS Always review your discharge instruction sheet given to you by the facility where your surgery was performed. IF YOU HAVE DISABILITY OR FAMILY LEAVE FORMS, YOU MUST BRING THEM TO THE OFFICE FOR PROCESSING.   DO NOT GIVE THEM TO YOUR DOCTOR.  PAIN CONTROL  1. First take acetaminophen (Tylenol) AND/or ibuprofen (Advil) to control your pain after surgery.  Follow directions on package.  Taking acetaminophen (Tylenol) and/or ibuprofen (Advil) regularly after surgery will help to control your pain and lower the amount of prescription pain medication you may need.  You should not take more than 3,000 mg (3 grams) of acetaminophen (Tylenol) in 24 hours.  You should not take ibuprofen (Advil), aleve, motrin, naprosyn or other NSAIDS if you have a history of stomach ulcers or chronic kidney disease.  2. A prescription for pain medication may be given to you upon discharge.  Take your pain medication as prescribed, if you still have uncontrolled pain after taking acetaminophen (Tylenol) or ibuprofen (Advil). 3. Use ice packs to help control pain. 4. If you need a refill on your pain medication, please contact your pharmacy.  They will contact our office to request authorization. Prescriptions will not be filled after 5pm or on week-ends.  HOME MEDICATIONS 5. Take your usually prescribed medications unless otherwise directed.  DIET 6. You should follow a light diet the first few days after arrival home.  Be sure to include lots of fluids daily. Avoid fatty, fried foods.   CONSTIPATION 7. It is common to experience some constipation after surgery and if you are taking pain medication.  Increasing fluid intake and taking a stool softener (such as Colace) will usually help or prevent this problem from occurring.  A mild laxative (Milk of Magnesia or Miralax) should be taken according to package instructions if there are no bowel movements after 48 hours.  WOUND/INCISION CARE 8. Most patients  will experience some swelling and bruising in the area of the incisions.  Ice packs will help.  Swelling and bruising can take several days to resolve.  9. Unless discharge instructions indicate otherwise, follow guidelines below  a. STERI-STRIPS - you may remove your outer bandages 48 hours after surgery, and you may shower at that time.  You have steri-strips (small skin tapes) in place directly over the incision.  These strips should be left on the skin for 7-10 days.   b. DERMABOND/SKIN GLUE - you may shower in 24 hours.  The glue will flake off over the next 2-3 weeks. 10. Any sutures or staples will be removed at the office during your follow-up visit.  ACTIVITIES 11. You may resume regular (light) daily activities beginning the next day--such as daily self-care, walking, climbing stairs--gradually increasing activities as tolerated.  You may have sexual intercourse when it is comfortable.  Refrain from any heavy lifting or straining until approved by  your doctor. a. You may drive when you are no longer taking prescription pain medication, you can comfortably wear a seatbelt, and you can safely maneuver your car and apply brakes.  FOLLOW-UP 12. You should see your doctor in the office for a follow-up appointment approximately 2-3 weeks after your surgery.  You should have been given your post-op/follow-up appointment when your surgery was scheduled.  If you did not receive a post-op/follow-up appointment, make sure that you call for this appointment within a day or two after you arrive home to insure a convenient appointment time.  OTHER INSTRUCTIONS 13. DO NOT LIFT/PUSH/PULL ANYTHING GREATER THAN 10LBS FOR 1 MONTH  WHEN TO CALL YOUR DOCTOR: 1. Fever over 101.0 2. Inability to urinate 3. Continued bleeding from incision. 4. Increased pain, redness, or drainage from the incision. 5. Increasing abdominal pain  The clinic staff is available to answer your questions during regular business  hours.  Please dont hesitate to call and ask to speak to one of the nurses for clinical concerns.  If you have a medical emergency, go to the nearest emergency room or call 911.  A surgeon from [facility] is always on call at the hospital. [address], [place], [place], [place]  [address] ? [address], [place], [place]   [phone] ? [phone] ? FAX [phone] Web site: [email or web address]     Managing Your Pain After Surgery Without Opioids    Thank you for participating in our program to help patients manage their pain after surgery without opioids. This is part of our effort to provide you with the best care possible, without exposing you or your family to the risk that opioids pose.  What pain can I expect after surgery? You can expect to have some pain after surgery. This is normal. The pain is typically worse the day after surgery, and quickly begins to get better. Many studies have found that many patients are able to manage their pain after surgery with Over-the-Counter (OTC) medications such as Tylenol and Motrin. If you have a condition that does not allow you to take Tylenol or Motrin, notify your surgical team.  How will I manage my pain? The best strategy for controlling your pain after surgery is around the clock pain control with Tylenol (acetaminophen) and Motrin (ibuprofen or Advil). Alternating these medications with each other allows you to maximize your pain control. In addition to Tylenol and Motrin, you can use heating pads or ice packs on your incisions to help reduce your pain.  How will I alternate your regular strength over-the-counter pain medication? You will take a dose of pain medication every three hours. ; Start by taking 650 mg of Tylenol (2 pills of 325 mg) ; 3 hours later take 600 mg of Motrin (3 pills of 200 mg) ; 3 hours after taking the Motrin take 650 mg of Tylenol ; 3 hours after that  take 600 mg of Motrin.   - 1 -  See example - if your first dose of Tylenol is at 12:00 PM   12:00 PM Tylenol 650 mg (2 pills of 325 mg)  3:00 PM Motrin 600 mg (3 pills of 200 mg)  6:00 PM Tylenol 650 mg (2 pills of 325 mg)  9:00 PM Motrin 600 mg (3 pills of 200 mg)  Continue alternating every 3 hours   We recommend that you follow this schedule around-the-clock for at least 3 days after surgery, or until you feel  that it is no longer needed. Use the table on the last page of this handout to keep track of the medications you are taking. Important: Do not take more than 3000mg  of Tylenol or 2300mg  of Motrin in a 24-hour period. Do not take ibuprofen/Motrin if you have a history of bleeding stomach ulcers, severe kidney disease, &/or actively taking a blood thinner  What if I still have pain? If you have pain that is not controlled with the over-the-counter pain medications (Tylenol and Motrin or Advil) you might have what we call breakthrough pain. You will receive a prescription for a small amount of an opioid pain medication such as Oxycodone, Tramadol, or Tylenol with Codeine. Use these opioid pills in the first 24 hours after surgery if you have breakthrough pain. Do not take more than 1 pill every 4-6 hours.  If you still have uncontrolled pain after using all opioid pills, don't hesitate to call our staff using the number provided. We will help make sure you are managing your pain in the best way possible, and if necessary, we can provide a prescription for additional pain medication.   Day 1    Time  Name of Medication Number of pills taken  Amount of Acetaminophen  Pain Level   Comments  AM PM       AM PM       AM PM       AM PM       AM PM       AM PM       AM PM       AM PM       Total Daily amount of Acetaminophen Do not take more than  3,000 mg per day      Day 2    Time  Name of Medication Number of pills taken  Amount of Acetaminophen  Pain Level    Comments  AM PM       AM PM       AM PM       AM PM       AM PM       AM PM       AM PM       AM PM       Total Daily amount of Acetaminophen Do not take more than  3,000 mg per day      Day 3    Time  Name of Medication Number of pills taken  Amount of Acetaminophen  Pain Level   Comments  AM PM       AM PM       AM PM       AM PM          AM PM       AM PM       AM PM       AM PM       Total Daily amount of Acetaminophen Do not take more than  3,000 mg per day      Day 4    Time  Name of Medication Number of pills taken  Amount of Acetaminophen  Pain Level   Comments  AM PM       AM PM       AM PM       AM PM       AM PM       AM PM  AM PM       AM PM       Total Daily amount of Acetaminophen Do not take more than  3,000 mg per day      Day 5    Time  Name of Medication Number of pills taken  Amount of Acetaminophen  Pain Level   Comments  AM PM       AM PM       AM PM       AM PM       AM PM       AM PM       AM PM       AM PM       Total Daily amount of Acetaminophen Do not take more than  3,000 mg per day       Day 6    Time  Name of Medication Number of pills taken  Amount of Acetaminophen  Pain Level  Comments  AM PM       AM PM       AM PM       AM PM       AM PM       AM PM       AM PM       AM PM       Total Daily amount of Acetaminophen Do not take more than  3,000 mg per day      Day 7    Time  Name of Medication Number of pills taken  Amount of Acetaminophen  Pain Level   Comments  AM PM       AM PM       AM PM       AM PM       AM PM       AM PM       AM PM       AM PM       Total Daily amount of Acetaminophen Do not take more than  3,000 mg per day        For additional information about how and where to safely dispose of unused opioid medications - [employer]  Disclaimer: This document contains information and/or instructional materials adapted from  [place] for the typical patient with your condition. It does not replace medical advice from your health care provider because your experience may differ from that of the typical patient. Talk to your health care provider if you have any questions about this document, your condition or your treatment plan. Adapted from [place]

## 2019-01-10 NOTE — Interval H&P Note (Signed)
History and Physical Interval Note:  01/10/2019 10:12 AM  Concha Pyo  has presented today for surgery, with the diagnosis of umbilical hernia  The various methods of treatment have been discussed with the patient and family. After consideration of risks, benefits and other options for treatment, the patient has consented to  Procedure(s): [place] (N/A) as a surgical intervention .  The patient's history has been reviewed, patient examined, no change in status, stable for surgery.  I have reviewed the patient's chart and labs.  Questions were answered to the patient's satisfaction.     Greer Pickerel

## 2019-01-10 NOTE — Anesthesia Preprocedure Evaluation (Signed)
Anesthesia Evaluation  Patient identified by MRN, date of birth, ID band Patient awake    Reviewed: Allergy & Precautions, NPO status , Patient's Chart, lab work & pertinent test results  Airway Mallampati: II  TM Distance: >3 FB Neck ROM: Full    Dental  (+) Teeth Intact   Pulmonary Current Smoker,    breath sounds clear to auscultation       Cardiovascular  Rhythm:Regular Rate:Normal     Neuro/Psych    GI/Hepatic   Endo/Other    Renal/GU      Musculoskeletal   Abdominal   Peds  Hematology   Anesthesia Other Findings   Reproductive/Obstetrics                             Anesthesia Physical Anesthesia Plan  ASA: II  Anesthesia Plan: General   Post-op Pain Management:    Induction: Intravenous  PONV Risk Score and Plan: Ondansetron and Dexamethasone  Airway Management Planned: Oral ETT  Additional Equipment:   Intra-op Plan:   Post-operative Plan: Extubation in OR  Informed Consent: I have reviewed the patients History and Physical, chart, labs and discussed the procedure including the risks, benefits and alternatives for the proposed anesthesia with the patient or authorized representative who has indicated his/her understanding and acceptance.     Dental advisory given  Plan Discussed with: CRNA and Anesthesiologist  Anesthesia Plan Comments:         Anesthesia Quick Evaluation

## 2019-01-10 NOTE — Progress Notes (Signed)
Anesthesiology note:  Mr. Brian Pierce underwent laparoscopic assisted repair of an umbilical hernia by Dr. Greer Pickerel.   Initial attempt at intubation was unsuccessful by CRNA using MAC 4 blade.  The patient was intubated using a Miller 2 blade. The posterior arytenoids were visualized and an Eischman stylette was passed through the cords. The endotracheal tube was then inserted over the Kol[place] stylette.  In the future, I recommend he be intubated with a glide scope.  Roberts Gaudy

## 2019-01-10 NOTE — Op Note (Signed)
KAL CHAIT [id number] [birth year] 01/10/2019   Laparoscopic Assisted Repair of Umbilical Hernia with Mesh Procedure Note  Indications: Symptomatic umbilical hernia  Pre-operative Diagnosis: umbilical hernia  Post-operative Diagnosis: umbilical hernia  Surgeon: Greer Pickerel MD FACS  Assistants: Byrd Hesselbach RNFA  Anesthesia: General endotracheal anesthesia   Procedure Details  The patient was seen in the Holding Room. The risks, benefits, complications, treatment options, and expected outcomes were discussed with the patient. The possibilities of reaction to medication, pulmonary aspiration, perforation of viscus, bleeding, recurrent infection, the need for additional procedures, failure to diagnose a condition, and creating a complication requiring transfusion or operation were discussed with the patient. The patient concurred with the proposed plan, giving informed consent.  The site of surgery properly noted/marked. The patient was taken to the operating room, identified as Brian Pierce  and the procedure verified as laparoscopic assisted umbilical hernia repair with mesh. A Time Out was held and the above information confirmed.    The patient voided prior to go to going to the operating room. The patient was placed supine.  After establishing general anesthesia, Left arm tucked with the appropriate padding.  The abdomen was prepped with Chloraprep and draped in standard fashion.  A 5 mm Optiview was used the cannulate the peritoneal cavity in the left upper quadrant below the costal margin.  Pneumoperitoneum was obtained by insufflating CO2, maintaining a maximum pressure of 15 mmHg.  The 5 mm 30-degree laparoscopic was inserted.  There were no significant omental adhesions to the anterior abdominal wall in and around the hernia defect.   Another 5-mm port was placed in the left lateral abdominal wall.  There was a plug of omentum incarcerated into the umbilical hernia.  Using traction  with an atraumatic grasper I was able to reduce the incarcerated plug of omentum.    I then took down the falciform ligament with hook electrocautery.   We visualized 1 fascial defect.   I then proceeded with the open portion of the procedure.  A curvilinear infraumbilical incision was created. Dissection was carried down to the hernia sac located above the fascia and was mobilized from surrounding structures. Intact fascia was identified circumferentially around the defect.  The hernia sac was excised and discarded.  Skin and soft tissue was mobilized from the surface of the fascia in a circumferential manner. A finger sweep was performed underneath the fascia to ensure there were no adhesions to the anterior abdominal wall.  I then measured the defect.  It measured 1.2cm x 1.2 cm.  With adding a 5 cm overlap circumferentially this gave [place] a mesh requirement of a round 10 cm.  Therefore I selected a round piece of Bard ventral light ST mesh measuring 11.4 cm We placed 4 stay sutures of 1-0 Novofil around the edges of the mesh.  The mesh was then rolled up and inserted through the fascial defect. The fascia was then closed primarily over the mesh with 4 interrupted 1-0-novafil sutures.   I then returned laparoscopically.  I then infiltrated local in a regional fashion in the preperitoneal space around the umbilical fascial closure and around the expected locations of where the transfascial sutures would be placed.  The mesh was then unrolled.  The stay sutures were then pulled up through small stab incisions using the Endo-close device.  This deployed the mesh widely over the fascial defects.  The stay sutures were then tied down.  The Secure Strap device was then used to tack down  the edges of the mesh at 1 cm intervals circumferentially. We placed a few tacks inside the outer ring of tacks.  We inspected for hemostasis.   Pneumoperitoneum was then released as we removed the remainder of the trocars.   The  umbilical cavity was irrigated. The umbilical stalk was then tacked back down to the fascia with two 3-0 vicryl sutures.  The port sites and umbilical incision were closed with 4-0 Monocryl.  All of the incisions and stay suture sites were then covered benzoin, steri-strips and bandages.  An abdominal binder was placed around the patient's abdomen.   The patient was extubated and brought to the recovery room in stable condition.  All sponge, instrument, and needle counts were correct prior to closure and at the conclusion of the case.   Findings: Type of repair - primary suture with mesh underlay Name of mesh - Bard VentralightST  Size of mesh - Round 11.4cm  Mesh overlap - >5 cm  Placement of mesh - beneath fascia and into peritoneal cavity,  Estimated Blood Loss:  Minimal         Complications:  None; patient tolerated the procedure well.         Disposition: PACU - hemodynamically stable.         Condition: stable  Leighton Ruff. Redmond Pulling, MD, FACS General, Bariatric, & Minimally Invasive Surgery [facility], [place]

## 2019-01-10 NOTE — Transfer of Care (Signed)
Immediate Anesthesia Transfer of Care Note  Patient: Brian Pierce  Procedure(s) Performed: LAPAROSCOPIC ASSISTED REPAIR OF UMBILICAL HERNIA WITH MESH ERAS PATHWAY (N/A )  Patient Location: PACU  Anesthesia Type:General  Level of Consciousness: awake, alert , oriented and patient cooperative  Airway & Oxygen Therapy: Patient Spontanous Breathing and Patient connected to face mask oxygen  Post-op Assessment: Report given to RN, Post -op Vital signs reviewed and stable and Patient moving all extremities  Post vital signs: Reviewed and stable  Last Vitals:  Vitals Value Taken Time  BP 155/92 01/10/2019 12:12 PM  Temp    Pulse 85 01/10/2019 12:14 PM  Resp 14 01/10/2019 12:14 PM  SpO2 99 % 01/10/2019 12:14 PM  Vitals shown include unvalidated device data.  Last Pain:  Vitals:   01/10/19 1213  TempSrc: (P) Oral  PainSc:          Complications: No apparent anesthesia complications

## 2019-01-10 NOTE — Anesthesia Procedure Notes (Signed)
Procedure Name: Intubation Date/Time: 01/10/2019 10:49 AM Performed by: Victoriano Lain, CRNA Pre-anesthesia Checklist: Patient identified, Emergency Drugs available, Suction available, Patient being monitored and Timeout performed Patient Re-evaluated:Patient Re-evaluated prior to induction Oxygen Delivery Method: Circle system utilized Preoxygenation: Pre-oxygenation with 100% oxygen Induction Type: IV induction Ventilation: Mask ventilation without difficulty and Oral airway inserted - appropriate to patient size Laryngoscope Size: Sabra Heck and 2 Grade View: Grade III Tube type: Oral Tube size: 7.5 mm Number of attempts: 2 Airway Equipment and Method: Stylet Placement Confirmation: ETT inserted through vocal cords under direct vision,  positive ETCO2 and breath sounds checked- equal and bilateral Secured at: 22 cm Tube secured with: Tape Dental Injury: Teeth and Oropharynx as per pre-operative assessment  Difficulty Due To: Difficult Airway- due to anterior larynx Comments: Easy mask with oral airway. DL X 1 with MAC 4. Grade 3-4 view. - ETCO2. ETT removed. Sats 100%. Masked with ease. DL X 1 with Mil 2. Grade 3 view. ATOI. + ETCO2. BBS=

## 2019-01-10 NOTE — Anesthesia Postprocedure Evaluation (Signed)
Anesthesia Post Note  Patient: Brian Pierce  Procedure(s) Performed: LAPAROSCOPIC ASSISTED REPAIR OF UMBILICAL HERNIA WITH MESH ERAS PATHWAY (N/A )     Patient location during evaluation: PACU Anesthesia Type: General Level of consciousness: awake and alert Pain management: pain level controlled Vital Signs Assessment: post-procedure vital signs reviewed and stable Respiratory status: spontaneous breathing, nonlabored ventilation, respiratory function stable and patient connected to nasal cannula oxygen Cardiovascular status: blood pressure returned to baseline and stable Postop Assessment: no apparent nausea or vomiting Anesthetic complications: no    Last Vitals:  Vitals:   01/10/19 1315 01/10/19 1345  BP: (!) 142/88 137/88  Pulse: 78 80  Resp: 14 16  Temp:  36.6 C  SpO2: 92% 94%    Last Pain:  Vitals:   01/10/19 1345  TempSrc:   PainSc: 3                  Estreya Clay COKER

## 2019-01-11 ENCOUNTER — Encounter (HOSPITAL_COMMUNITY): Payer: Self-pay | Admitting: General Surgery

## 2019-03-16 ENCOUNTER — Other Ambulatory Visit: Payer: Self-pay

## 2019-03-16 ENCOUNTER — Emergency Department (HOSPITAL_COMMUNITY)
Admission: EM | Admit: 2019-03-16 | Discharge: 2019-03-16 | Disposition: A | Discharge status: Home / Self Care | Payer: 59 | Attending: Emergency Medicine | Admitting: Emergency Medicine

## 2019-03-16 ENCOUNTER — Encounter (HOSPITAL_COMMUNITY): Payer: Self-pay

## 2019-03-16 DIAGNOSIS — M62838 Other muscle spasm: Secondary | ICD-10-CM | POA: Diagnosis not present

## 2019-03-16 DIAGNOSIS — R51 Headache: Secondary | ICD-10-CM | POA: Diagnosis present

## 2019-03-16 DIAGNOSIS — G44209 Tension-type headache, unspecified, not intractable: Secondary | ICD-10-CM | POA: Diagnosis not present

## 2019-03-16 DIAGNOSIS — F17210 Nicotine dependence, cigarettes, uncomplicated: Secondary | ICD-10-CM | POA: Insufficient documentation

## 2019-03-16 LAB — CBG MONITORING, ED: Glucose-Capillary: 237 mg/dL — ABNORMAL HIGH (ref 70–99)

## 2019-03-16 NOTE — ED Notes (Signed)
Pt discharged from ED; instructions provided; Pt encouraged to return to ED if symptoms worsen and to f/u with PCP; Pt verbalized understanding of all instructions 

## 2019-03-16 NOTE — ED Triage Notes (Signed)
Pt to ER with c/o HA that has been going on for the last several months. Pt states that his PCP told him that his BS have been elevated. Pt states that he has this "swimming, fuzzy feeling). Pt states that he feels that he loss brief period of consciousness for a "few seconds."

## 2019-03-16 NOTE — Discharge Instructions (Signed)
You may use over-the-counter Motrin (Ibuprofen), Acetaminophen (Tylenol), topical muscle creams such as SalonPas, Icy Hot, Bengay, etc. Please stretch, apply heat, and have massage therapy for additional assistance. ° °

## 2019-03-16 NOTE — ED Provider Notes (Signed)
[facility] EMERGENCY DEPARTMENT Provider Note  CSN: [id number] Arrival date & time: 03/16/19 1856  Chief Complaint(s) Headache  HPI Brian Pierce is a 48 y.o. male   The history is provided by the patient.  Headache  Pain location:  Occipital Quality: "pinching" "swimmy" Radiates to: up to calvaria. Pain severity now: Mild. Pain scale at highest: Severe. Onset quality:  Sudden Duration: varies; ususally several hours. Timing:  Intermittent Progression:  Waxing and waning Chronicity:  Recurrent Similar to prior headaches: yes   Context comment:  Usually occurs at night while lying down Relieved by:  NSAIDs Worsened by:  Neck movement Associated symptoms: dizziness (when pain is severe)   Associated symptoms: no back pain, no cough, no diarrhea, no fever, no focal weakness, no hearing loss, no loss of balance, no myalgias, no nausea, no neck stiffness, no paresthesias, no photophobia and no syncope    Patient reports that 3 months ago he hit his head on forklift metal forks in the back of his head.  Since then he has been having these intermittent headaches.  Reports that initially were severe but subsided.  Over the past several weeks they have gradually increased in frequency.   Past Medical History Past Medical History:  Diagnosis Date  . Arthritis   . Back pain    lower  . Dizzy spells    " work injury hit with piece of steel feel dizzy and wavy feeling without pain hit with steel 2 months ago"  . Elevated liver enzymes 06/2018  . GERD (gastroesophageal reflux disease)   . History of kidney stones   . Umbilical hernia    Patient Active Problem List   Diagnosis Date Noted  . ALLERGIC RHINITIS 08/14/2008  . GERD 08/14/2008  . LIVER FUNCTION TESTS, ABNORMAL, HX OF 08/14/2008   Home Medication(s) Prior to Admission medications   Medication Sig Start Date End Date Taking? Authorizing Provider  gabapentin (NEURONTIN) 300 MG capsule Take 1 capsule  (300 mg total) by mouth 2 (two) times daily for 7 days. 01/10/19 01/17/19  Greer Pickerel, MD  omeprazole (PRILOSEC) 40 MG capsule Take 40 mg by mouth daily before breakfast.    [provider]  oxyCODONE (OXY IR/ROXICODONE) 5 MG immediate release tablet Take 1 tablet (5 mg total) by mouth every 6 (six) hours as needed for severe pain. 01/10/19   Greer Pickerel, MD                                                                                                                                    Past Surgical History Past Surgical History:  Procedure Laterality Date  . colonscopy and endoscopy  12 yrs ago  . HERNIA REPAIR Bilateral age 7   inguinal   . UMBILICAL HERNIA REPAIR N/A 01/10/2019   Procedure: LAPAROSCOPIC ASSISTED REPAIR OF UMBILICAL HERNIA WITH MESH ERAS PATHWAY;  Surgeon: Greer Pickerel, MD;  Location: [facility];  Service: General;  Laterality: N/A;  . wisdom teeth extraction     Family History History reviewed. No pertinent family history.  Social History Social History   Tobacco Use  . Smoking status: Current Every Day Smoker    Packs/day: 0.50  . Smokeless tobacco: Never Used  . Tobacco comment: light smoker 3 per day  Substance Use Topics  . Alcohol use: Yes    Comment: 4-5 drinks per day  . Drug use: Yes    Types: Marijuana    Comment: marijuana last used Sep 08, 2018   Allergies Codone [hydrocodone]  Review of Systems Review of Systems  Constitutional: Negative for fever.  HENT: Negative for hearing loss.   Eyes: Negative for photophobia.  Respiratory: Negative for cough.   Cardiovascular: Negative for syncope.  Gastrointestinal: Negative for diarrhea and nausea.  Musculoskeletal: Negative for back pain, myalgias and neck stiffness.  Neurological: Positive for dizziness (when pain is severe) and headaches. Negative for focal weakness, paresthesias and loss of balance.   All other systems are reviewed and are negative for acute change except as noted in the HPI   Physical Exam Vital Signs  I have reviewed the triage vital signs Vitals:   03/16/19 0339 03/16/19 0415  BP:  128/72  Pulse:  76  Resp:  13  Temp: (!) 97.5 F (36.4 C)   SpO2:  95%   Physical Exam Vitals signs reviewed.  Constitutional:      General: He is not in acute distress.    Appearance: He is well-developed. He is not diaphoretic.  HENT:     Head: Normocephalic and atraumatic.     Jaw: No trismus.     Right Ear: External ear normal.     Left Ear: External ear normal.     Nose: Nose normal.  Eyes:     General: No scleral icterus.    Conjunctiva/sclera: Conjunctivae normal.     Pupils: Pupils are equal, round, and reactive to light.  Neck:     Musculoskeletal: Normal range of motion.     Trachea: Phonation normal.  Cardiovascular:     Rate and Rhythm: Normal rate and regular rhythm.  Pulmonary:     Effort: Pulmonary effort is normal. No respiratory distress.     Breath sounds: No stridor.  Abdominal:     General: There is no distension.  Musculoskeletal: Normal range of motion.     Cervical back: He exhibits spasm.       Back:  Neurological:     Mental Status: He is alert and oriented to person, place, and time.     Comments: Mental Status:  Alert and oriented to person, place, and time.  Attention and concentration normal.  Speech clear.  Recent memory is intact  Cranial Nerves:  II Visual Fields: Intact to confrontation. Visual fields intact. III, IV, VI: Pupils equal and reactive to light and near. Full eye movement without nystagmus  V Facial Sensation: Normal. No weakness of masticatory muscles  VII: No facial weakness or asymmetry  VIII Auditory Acuity: Grossly normal  IX/X: The uvula is midline; the palate elevates symmetrically  XI: Normal sternocleidomastoid and trapezius strength  XII: The tongue is midline. No atrophy or fasciculations.   Motor System: Muscle Strength: 5/5 and symmetric in the upper and lower extremities. No pronation or  drift.  Muscle Tone: Tone and muscle bulk are normal in the upper and lower extremities.   Reflexes: DTRs: 1+ and symmetrical in all four  extremities. No Clonus Coordination: Intact finger-to-nose.No tremor.  Sensation: Intact to light touch. Gait: Routine  gait normal.   Psychiatric:        Behavior: Behavior normal.     ED Results and Treatments Labs (all labs ordered are listed, but only abnormal results are displayed) Labs Reviewed - No data to display                                                                                                                       EKG  EKG Interpretation  Date/Time:    Ventricular Rate:    PR Interval:    QRS Duration:   QT Interval:    QTC Calculation:   R Axis:     Text Interpretation:        Radiology No results found. Pertinent labs & imaging results that were available during my care of the patient were reviewed by me and considered in my medical decision making (see chart for details).  Medications Ordered in ED Medications - No data to display                                                                                                                                  Procedures Procedures  (including critical care time)  Medical Decision Making / ED Course I have reviewed the nursing notes for this encounter and the patient's prior records (if available in EHR or on provided paperwork).    Non focal neuro exam. No recent head trauma. No fever. Doubt meningitis. Doubt intracranial bleed. Doubt IIH. No indication for imaging.   Patient reports that his pain has significantly improved after taking 600 mg of ibuprofen just prior to arrival.  Patient has spastic neck musculature which I believe is the cause of his symptoms.  Recommended continued supportive and symptomatic management.  The patient appears reasonably screened and/or stabilized for discharge and I doubt any other medical condition or other [facility] requiring  further screening, evaluation, or treatment in the ED at this time prior to discharge.  The patient is safe for discharge with strict return precautions.   Final Clinical Impression(s) / ED Diagnoses Final diagnoses:  Muscle tension headache    Disposition: Discharge  Condition: Good  I have discussed the results, Dx and Tx plan with the patient who expressed understanding and agree(s) with the plan. Discharge instructions discussed at great length. The patient was given strict return  precautions who verbalized understanding of the instructions. No further questions at time of discharge.    ED Discharge Orders    None       Follow Up: Deland Pretty, [place] [address] [phone]  Schedule an appointment as soon as possible for a visit  As needed     This chart was dictated using voice recognition software.  Despite best efforts to proofread,  errors can occur which can change the documentation meaning.   Fatima Blank, MD 03/16/19 [phone]

## 2019-03-29 DIAGNOSIS — R7309 Other abnormal glucose: Secondary | ICD-10-CM | POA: Diagnosis not present

## 2019-03-29 DIAGNOSIS — E782 Mixed hyperlipidemia: Secondary | ICD-10-CM | POA: Diagnosis not present

## 2019-03-29 DIAGNOSIS — G4452 New daily persistent headache (NDPH): Secondary | ICD-10-CM | POA: Diagnosis not present

## 2019-03-29 DIAGNOSIS — R42 Dizziness and giddiness: Secondary | ICD-10-CM | POA: Diagnosis not present

## 2019-03-29 DIAGNOSIS — S0990XA Unspecified injury of head, initial encounter: Secondary | ICD-10-CM | POA: Diagnosis not present

## 2019-03-29 DIAGNOSIS — R2689 Other abnormalities of gait and mobility: Secondary | ICD-10-CM | POA: Diagnosis not present

## 2019-04-04 ENCOUNTER — Other Ambulatory Visit: Payer: Self-pay | Admitting: Internal Medicine

## 2019-04-04 DIAGNOSIS — R42 Dizziness and giddiness: Secondary | ICD-10-CM

## 2019-04-04 DIAGNOSIS — F1010 Alcohol abuse, uncomplicated: Secondary | ICD-10-CM | POA: Diagnosis not present

## 2019-04-04 DIAGNOSIS — Z125 Encounter for screening for malignant neoplasm of prostate: Secondary | ICD-10-CM | POA: Diagnosis not present

## 2019-04-04 DIAGNOSIS — R2689 Other abnormalities of gait and mobility: Secondary | ICD-10-CM

## 2019-04-04 DIAGNOSIS — R7309 Other abnormal glucose: Secondary | ICD-10-CM | POA: Diagnosis not present

## 2019-04-04 DIAGNOSIS — R748 Abnormal levels of other serum enzymes: Secondary | ICD-10-CM | POA: Diagnosis not present

## 2019-04-04 DIAGNOSIS — E1165 Type 2 diabetes mellitus with hyperglycemia: Secondary | ICD-10-CM | POA: Diagnosis not present

## 2019-04-04 DIAGNOSIS — E7800 Pure hypercholesterolemia, unspecified: Secondary | ICD-10-CM | POA: Diagnosis not present

## 2019-04-04 DIAGNOSIS — S0990XA Unspecified injury of head, initial encounter: Secondary | ICD-10-CM

## 2019-04-04 MED FILL — metFORMIN HCL ER 500 MG TB2: 500 | 30 days supply | Qty: 120 | Fill #0

## 2019-04-05 MED FILL — FREESTYLE LITE METER: 30 days supply | Qty: 1 | Fill #0

## 2019-04-05 MED FILL — FREESTYLE LITE TEST STRIP: 90 days supply | Qty: 100 | Fill #0

## 2019-04-05 MED FILL — FREESTYLE LANCETS: 30 days supply | Qty: 100 | Fill #0

## 2019-04-10 ENCOUNTER — Other Ambulatory Visit: Payer: Self-pay | Admitting: Internal Medicine

## 2019-04-10 ENCOUNTER — Ambulatory Visit
Admission: RE | Admit: 2019-04-10 | Discharge: 2019-04-10 | Disposition: A | Discharge status: Home / Self Care | Payer: 59 | Source: Ambulatory Visit | Attending: Internal Medicine | Admitting: Internal Medicine

## 2019-04-10 ENCOUNTER — Other Ambulatory Visit: Payer: Self-pay

## 2019-04-10 DIAGNOSIS — S0990XA Unspecified injury of head, initial encounter: Secondary | ICD-10-CM

## 2019-04-10 DIAGNOSIS — R2689 Other abnormalities of gait and mobility: Secondary | ICD-10-CM

## 2019-04-10 DIAGNOSIS — R42 Dizziness and giddiness: Secondary | ICD-10-CM | POA: Diagnosis not present

## 2019-04-10 DIAGNOSIS — R51 Headache: Secondary | ICD-10-CM | POA: Diagnosis not present

## 2019-04-10 DIAGNOSIS — R519 Headache, unspecified: Secondary | ICD-10-CM

## 2019-04-10 DIAGNOSIS — H538 Other visual disturbances: Secondary | ICD-10-CM | POA: Diagnosis not present

## 2019-04-10 IMAGING — MR MRI HEAD WITHOUT AND WITH CONTRAST
12 series · 48 of 48 positions shown · IV contrast (multihance)
Comparison: None.

CLINICAL DATA: 47-year-old male status post head injury 4 months
ago on forklift blade. Occipital pain radiating anteriorly with
dizziness, loss of balance, blurred vision.

Creatinine was obtained on site at [HOSPITAL] at [HOSPITAL].
Results: Creatinine 1.0 mg/dL.
EXAM:
MRI HEAD WITHOUT AND WITH CONTRAST
TECHNIQUE: Multiplanar, multiecho pulse sequences of the brain and surrounding
structures were obtained without and with intravenous contrast.
CONTRAST:  20mL MULTIHANCE GADOBENATE DIMEGLUMINE 529 MG/ML IV SOLN

[Series 5: T1 · sagittal · 4.0mm · 0.75mm/px · 2 of 31 slices shown (1 of 3)]
[im 1/31]
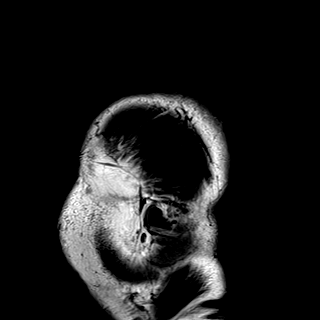
[im 31/31]
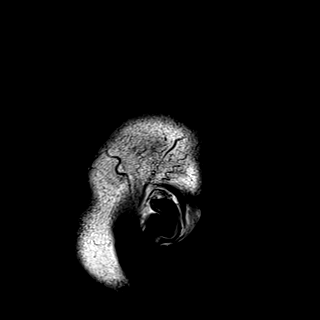

[Series 6: DWI · axial · 3.0mm · 1.44mm/px · z∈[-66,+82]mm · 5 of 92 slices shown (1 of 4)]
[im 1/92]
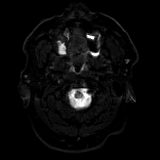
[im 23/92]
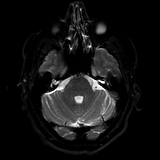
[im 46/92]
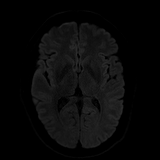
[im 69/92]
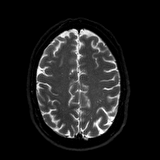
[im 92/92]
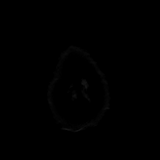

[Series 7: DWI · axial · 3.0mm · 1.44mm/px · z∈[-66,+82]mm · 3 of 46 slices shown (2 of 4)]
[im 1/46]
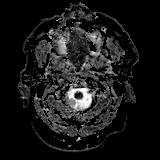
[im 23/46]
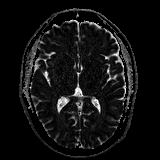
[im 46/46]
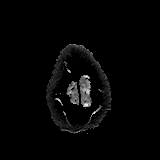

[Series 8: DWI · coronal · 5.0mm · 1.44mm/px · 4 of 60 slices shown (3 of 4)]
[im 1/60]
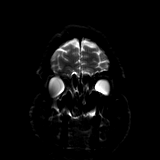
[im 20/60]
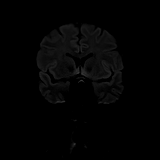
[im 40/60]
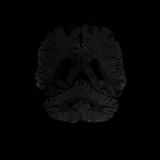
[im 60/60]
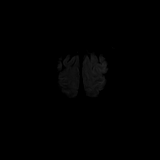

[Series 9: DWI · coronal · 5.0mm · 1.44mm/px · 2 of 30 slices shown (4 of 4)]
[im 1/30]
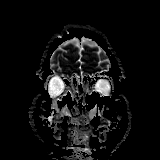
[im 30/30]
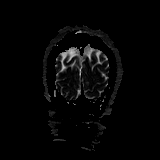

[Series 10: T2 · axial · 4.0mm · 0.36mm/px · z∈[-67,+82]mm · 2 of 30 slices shown]
[im 1/30]
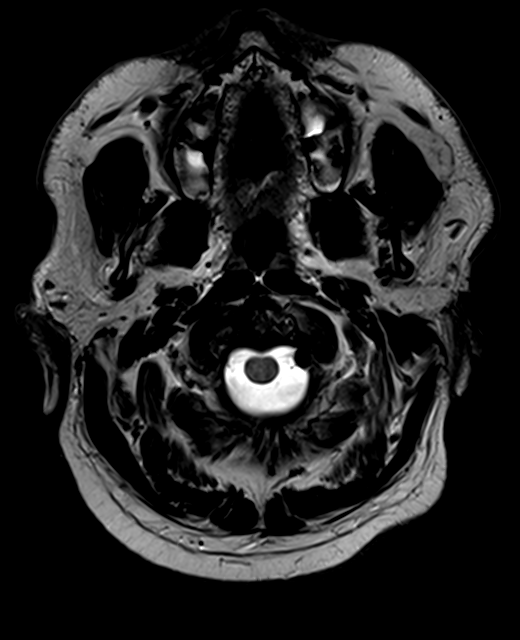
[im 30/30]
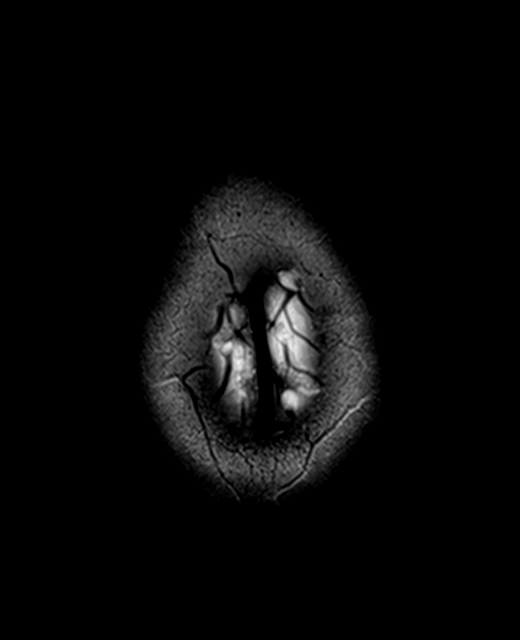

[Series 11: FLAIR · axial · 3.0mm · 0.72mm/px · z∈[-69,+80]mm · 2 of 26 slices shown]
[im 1/26]
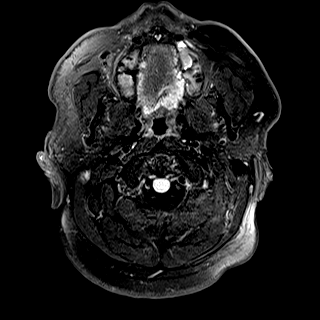
[im 26/26]
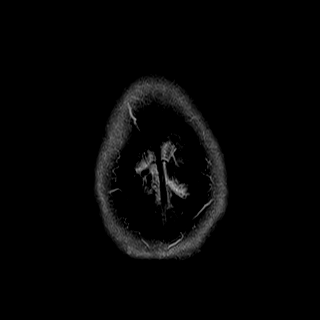

[Series 13: swi_images · axial · 1.5mm · 0.90mm/px · z∈[-69,+84]mm · 6 of 104 slices shown]
[im 1/104]
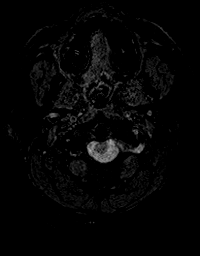
[im 21/104]
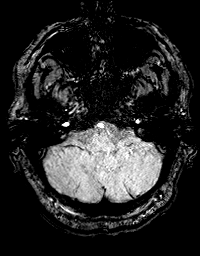
[im 42/104]
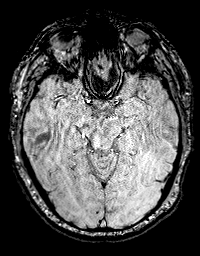
[im 62/104]
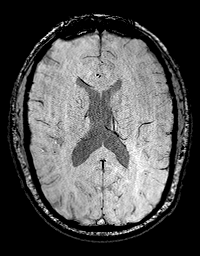
[im 83/104]
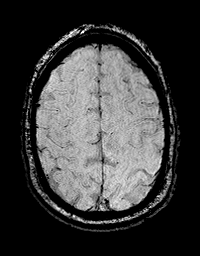
[im 104/104]
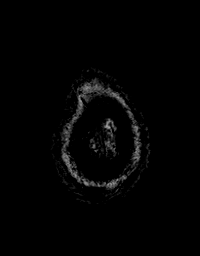

[Series 14: T1 · axial · 1.0mm · 0.90mm/px · z∈[-71,+87]mm · 9 of 160 slices shown (2 of 3)]
[im 1/160]
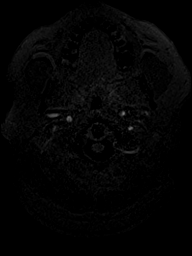
[im 20/160]
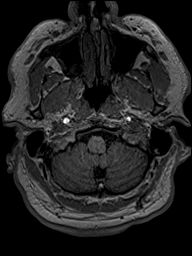
[im 40/160]
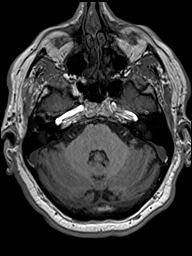
[im 60/160]
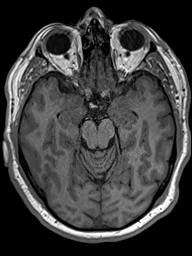
[im 80/160]
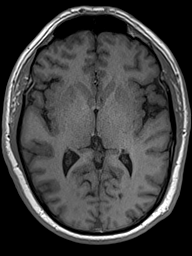
[im 100/160]
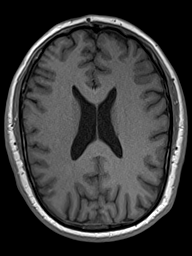
[im 120/160]
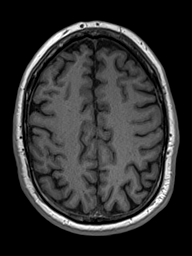
[im 140/160]
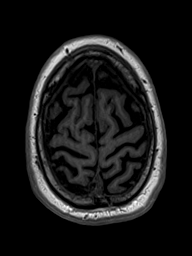
[im 160/160]
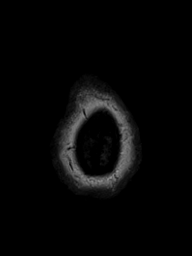

[Series 15: T2 post-contrast · coronal · 4.5mm · 0.36mm/px · 2 of 35 slices shown]
[im 1/35]
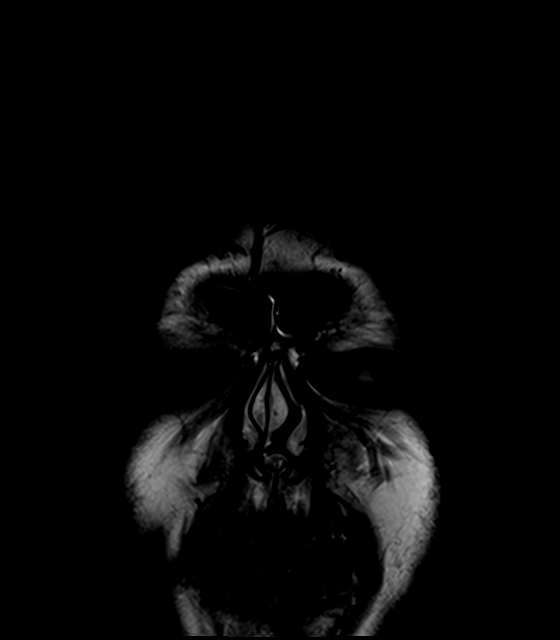
[im 35/35]
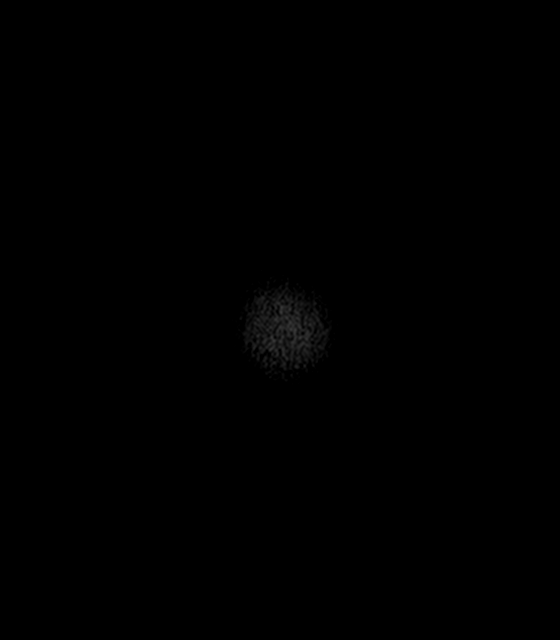

[Series 16: T1 · axial · 1.0mm · 0.90mm/px · z∈[-71,+87]mm · 9 of 160 slices shown (3 of 3)]
[im 1/160]
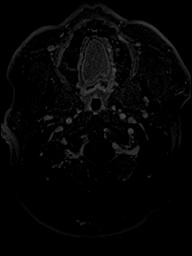
[im 20/160]
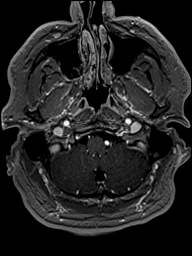
[im 40/160]
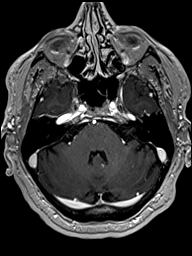
[im 60/160]
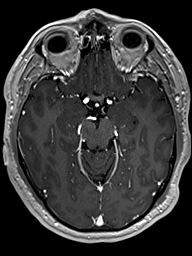
[im 80/160]
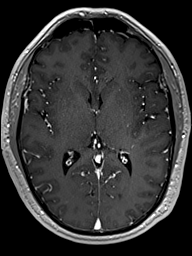
[im 100/160]
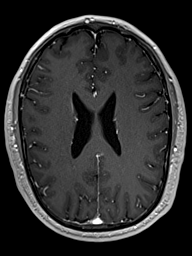
[im 120/160]
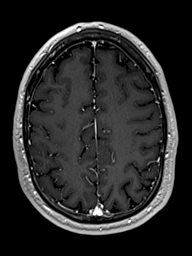
[im 140/160]
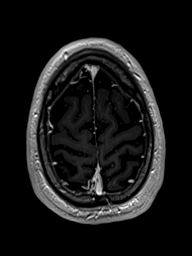
[im 160/160]
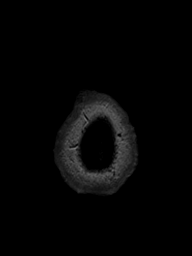

[Series 17: T1 post-contrast · coronal · 4.5mm · 0.72mm/px · 2 of 35 slices shown]
[im 1/35]
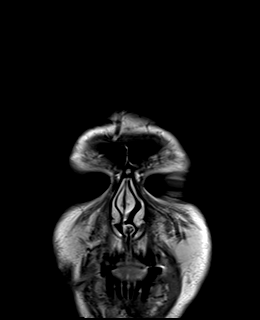
[im 35/35]
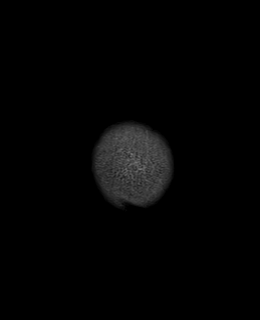

[48 of 48 positions shown; findings below may reference images not displayed]

FINDINGS: Brain: No restricted diffusion to suggest acute infarction. No
midline shift, mass effect, evidence of mass lesion,
ventriculomegaly, extra-axial collection or acute intracranial
hemorrhage. Cervicomedullary junction and pituitary are within
normal limits.

Gray and white matter signal is within normal limits throughout the
brain. No encephalomalacia or chronic cerebral blood products
identified. No abnormal enhancement identified. No dural thickening.

Vascular: Major intracranial vascular flow voids are preserved, the
left vertebral artery appears dominant. The major dural venous
sinuses are enhancing and appear to be patent.

Skull and upper cervical spine: Negative visible cervical spine.
Visualized bone marrow signal is within normal limits.

Sinuses/Orbits: Negative orbits. Scattered ethmoid and maxillary
sinus mucosal thickening and/or small mucous retention cysts. The
frontal and sphenoid sinuses are well pneumatized. No sinus fluid
levels.

Other: Mastoids are clear. Visible internal auditory structures
appear normal. Normal stylomastoid foramina. Scalp and face soft
tissues appear negative
IMPRESSION: 1.  Normal MRI appearance of the brain.
2. Scattered ethmoid and maxillary sinus mucosal thickening and/or
small mucous retention cysts without strong evidence of acute
sinusitis.

## 2019-04-10 MED ORDER — GADOBENATE DIMEGLUMINE 529 MG/ML IV SOLN
20.0000 mL | Freq: Once | INTRAVENOUS | Status: AC | PRN
  Administered 2019-04-10: 08:00:00 20 mL via INTRAVENOUS

## 2019-04-11 ENCOUNTER — Telehealth: Payer: Self-pay | Admitting: Diagnostic Neuroimaging

## 2019-04-11 NOTE — Telephone Encounter (Signed)
°  Due to current COVID 19 pandemic, our office is severely reducing in office visits until further notice, in order to minimize the risk to our patients and healthcare providers.    Called patient and scheduled a virtual visit with Dr. Leta Baptist for 6/10. Patient verbalized understanding of the doxy.me process and I have sent an e-mail to [email or web address] with link and instructions as well as my name and our office number. Patient understands that they will receive a call from RN to update chart.   Pt understands that although there may be some limitations with this type of visit, we will take all precautions to reduce any security or privacy concerns.  Pt understands that this will be treated like an in office visit and we will file with pt's insurance, and there may be a patient responsible charge related to this service.

## 2019-04-18 ENCOUNTER — Encounter: Payer: Self-pay | Admitting: Diagnostic Neuroimaging

## 2019-04-18 NOTE — Telephone Encounter (Signed)
Called patient back and updated EMR.

## 2019-04-18 NOTE — Addendum Note (Signed)
Addended by: Florian Buff C on: 04/18/2019 12:00 PM   Modules accepted: Orders

## 2019-04-18 NOTE — Telephone Encounter (Signed)
.  Called patient and LVM requesting call back to update EMR.  

## 2019-04-19 ENCOUNTER — Ambulatory Visit (INDEPENDENT_AMBULATORY_CARE_PROVIDER_SITE_OTHER): Payer: 59 | Admitting: Diagnostic Neuroimaging

## 2019-04-19 ENCOUNTER — Other Ambulatory Visit: Payer: Self-pay

## 2019-04-19 ENCOUNTER — Telehealth: Payer: Self-pay | Admitting: Diagnostic Neuroimaging

## 2019-04-19 DIAGNOSIS — Z0289 Encounter for other administrative examinations: Secondary | ICD-10-CM

## 2019-04-19 NOTE — Telephone Encounter (Signed)
Spoke with patient, he was unable to connect for his virtual visit today. He stated doxy was not allowing him to enable his camera and microphone. He tried changing the settings on his phone, but even this did not work. I offered to reschedule his apt, but he wanted to hold off for now as his sxs seem to be improving. Will call us back if appointment is necessary.

## 2019-05-02 DIAGNOSIS — E1165 Type 2 diabetes mellitus with hyperglycemia: Secondary | ICD-10-CM | POA: Diagnosis not present

## 2019-05-02 DIAGNOSIS — E782 Mixed hyperlipidemia: Secondary | ICD-10-CM | POA: Diagnosis not present

## 2019-05-02 DIAGNOSIS — R748 Abnormal levels of other serum enzymes: Secondary | ICD-10-CM | POA: Diagnosis not present

## 2019-10-24 MED FILL — OMEPRAZOLE 20 MG CAPSULE DR: 20 | 90 days supply | Qty: 90 | Fill #0

## 2019-11-01 ENCOUNTER — Emergency Department (HOSPITAL_COMMUNITY): Payer: 59

## 2019-11-01 ENCOUNTER — Other Ambulatory Visit: Payer: Self-pay

## 2019-11-01 ENCOUNTER — Encounter (HOSPITAL_COMMUNITY): Payer: Self-pay | Admitting: Emergency Medicine

## 2019-11-01 ENCOUNTER — Emergency Department (HOSPITAL_COMMUNITY)
Admission: EM | Admit: 2019-11-01 | Discharge: 2019-11-01 | Disposition: A | Discharge status: Home / Self Care | Payer: 59 | Attending: Emergency Medicine | Admitting: Emergency Medicine

## 2019-11-01 DIAGNOSIS — F17290 Nicotine dependence, other tobacco product, uncomplicated: Secondary | ICD-10-CM | POA: Diagnosis not present

## 2019-11-01 DIAGNOSIS — R109 Unspecified abdominal pain: Secondary | ICD-10-CM | POA: Insufficient documentation

## 2019-11-01 DIAGNOSIS — Z7984 Long term (current) use of oral hypoglycemic drugs: Secondary | ICD-10-CM | POA: Diagnosis not present

## 2019-11-01 DIAGNOSIS — E119 Type 2 diabetes mellitus without complications: Secondary | ICD-10-CM | POA: Diagnosis not present

## 2019-11-01 DIAGNOSIS — N200 Calculus of kidney: Secondary | ICD-10-CM | POA: Diagnosis not present

## 2019-11-01 LAB — CBC WITH DIFFERENTIAL/PLATELET
Abs Immature Granulocytes: 0.07 10*3/uL (ref 0.00–0.07)
Basophils Absolute: 0 10*3/uL (ref 0.0–0.1)
Basophils Relative: 0 %
Eosinophils Absolute: 0.2 10*3/uL (ref 0.0–0.5)
Eosinophils Relative: 2 %
HCT: 47.9 % (ref 39.0–52.0)
Hemoglobin: 16.4 g/dL (ref 13.0–17.0)
Immature Granulocytes: 1 %
Lymphocytes Relative: 15 %
Lymphs Abs: 1.6 10*3/uL (ref 0.7–4.0)
MCH: 30.5 pg (ref 26.0–34.0)
MCHC: 34.2 g/dL (ref 30.0–36.0)
MCV: 89 fL (ref 80.0–100.0)
Monocytes Absolute: 0.6 10*3/uL (ref 0.1–1.0)
Monocytes Relative: 6 %
Neutro Abs: 8.2 10*3/uL — ABNORMAL HIGH (ref 1.7–7.7)
Neutrophils Relative %: 76 %
Platelets: 189 10*3/uL (ref 150–400)
RBC: 5.38 MIL/uL (ref 4.22–5.81)
RDW: 12.2 % (ref 11.5–15.5)
WBC: 10.7 10*3/uL — ABNORMAL HIGH (ref 4.0–10.5)
nRBC: 0 % (ref 0.0–0.2)

## 2019-11-01 LAB — BASIC METABOLIC PANEL
Anion gap: 10 (ref 5–15)
BUN: 25 mg/dL — ABNORMAL HIGH (ref 6–20)
CO2: 22 mmol/L (ref 22–32)
Calcium: 8.9 mg/dL (ref 8.9–10.3)
Chloride: 106 mmol/L (ref 98–111)
Creatinine, Ser: 1 mg/dL (ref 0.61–1.24)
GFR calc Af Amer: >60 mL/min (ref >60)
GFR calc non Af Amer: >60 mL/min (ref >60)
Glucose, Bld: 155 mg/dL — ABNORMAL HIGH (ref 70–99)
Potassium: 3.9 mmol/L (ref 3.5–5.1)
Sodium: 138 mmol/L (ref 135–145)

## 2019-11-01 LAB — URINALYSIS, ROUTINE W REFLEX MICROSCOPIC
Bacteria, UA: NONE SEEN
Bilirubin Urine: NEGATIVE
Glucose, UA: NEGATIVE mg/dL
Hgb urine dipstick: NEGATIVE
Ketones, ur: NEGATIVE mg/dL
Nitrite: NEGATIVE
Protein, ur: 30 mg/dL — AB
Specific Gravity, Urine: 1.026 (ref 1.005–1.030)
pH: 6 (ref 5.0–8.0)

## 2019-11-01 IMAGING — CT CT RENAL STONE PROTOCOL
2 of 4 series · 15 of 46 positions shown, 17 images · non-contrast
Comparison: CT the abdomen and pelvis [DATE].

CLINICAL DATA: 48-year-old male with history of flank pain.
Suspected stone disease.

EXAM:
CT ABDOMEN AND PELVIS WITHOUT CONTRAST
TECHNIQUE: Multidetector CT imaging of the abdomen and pelvis was performed
following the standard protocol without IV contrast.

[Series 2: axial st · axial · 0.96mm/px · z∈[+1006,+1506]mm · 12 of 114 slices shown, 14 images]
[im 7/114  soft-tissue]
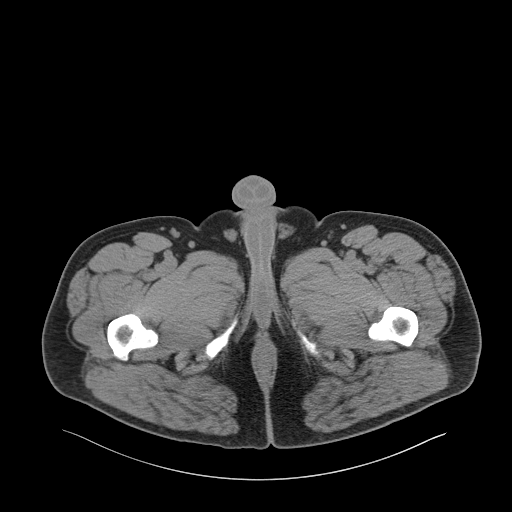
[im 7/114  bone]
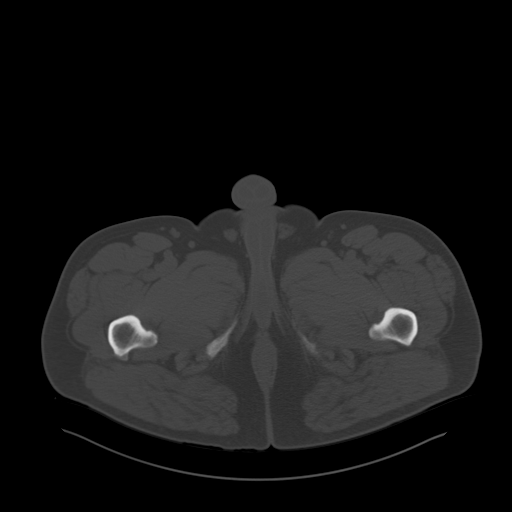
[im 20/114  soft-tissue]
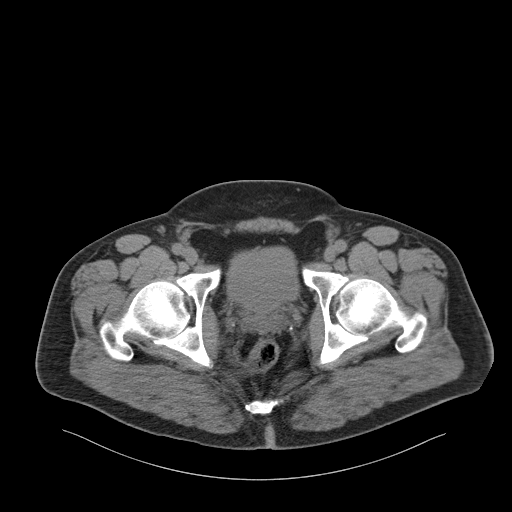
[im 27/114  soft-tissue]
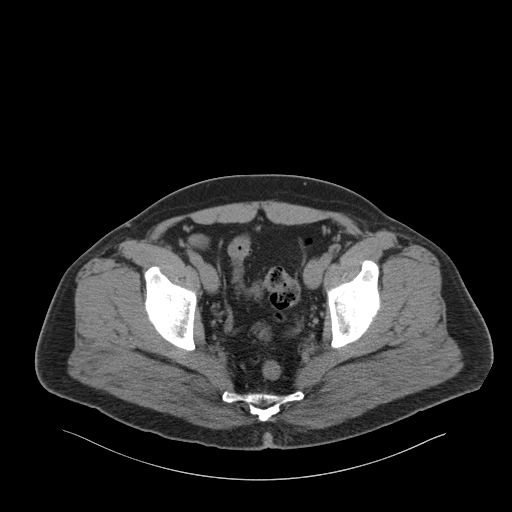
[im 34/114  soft-tissue]
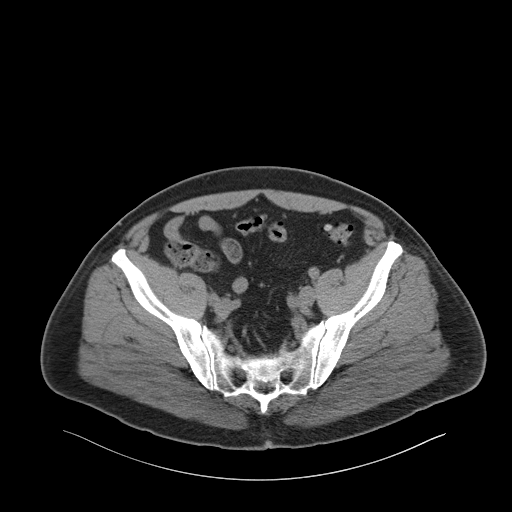
[im 47/114  soft-tissue]
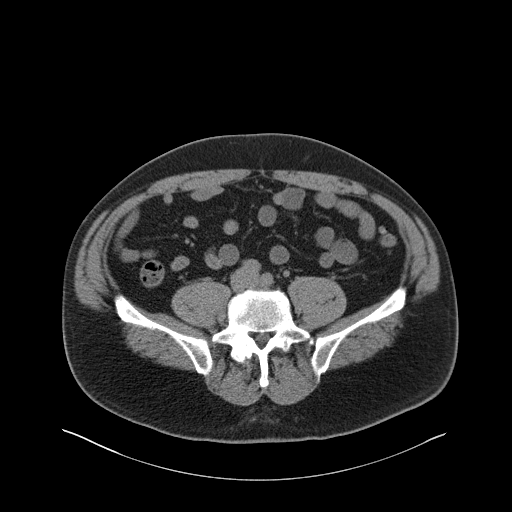
[im 54/114  soft-tissue]
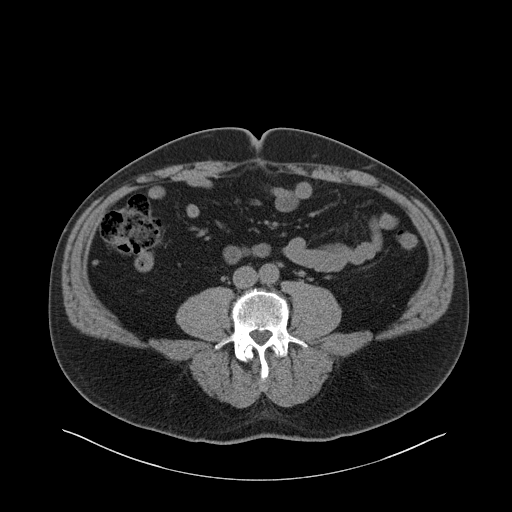
[im 60/114  soft-tissue]
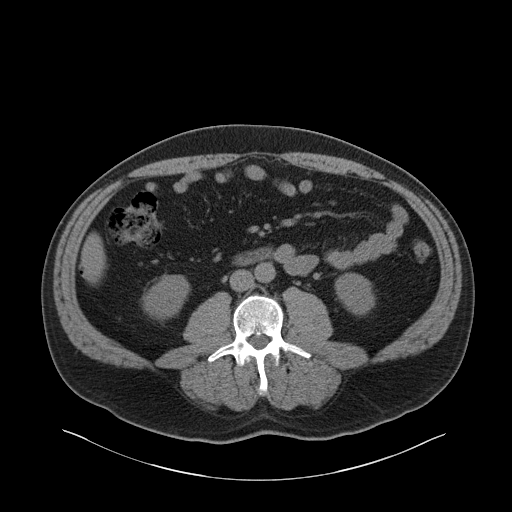
[im 74/114  soft-tissue]
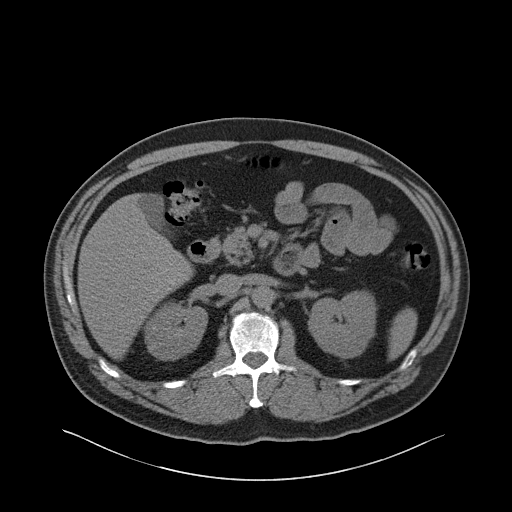
[im 80/114  soft-tissue]
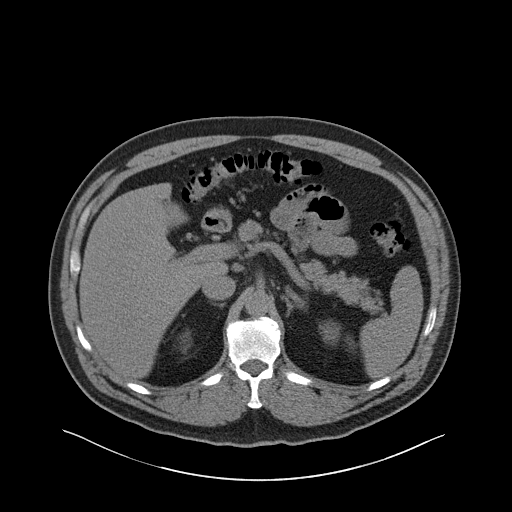
[im 80/114  bone]
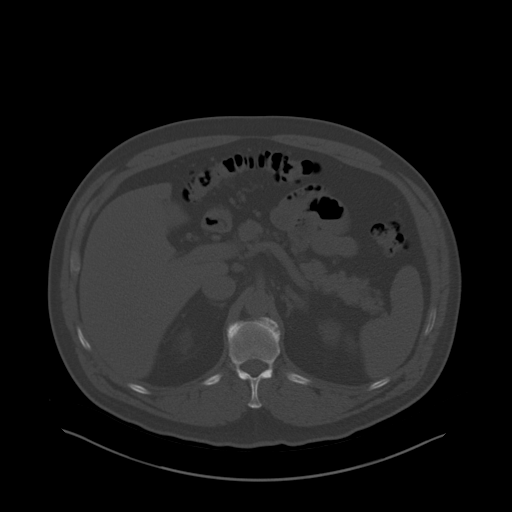
[im 87/114  soft-tissue]
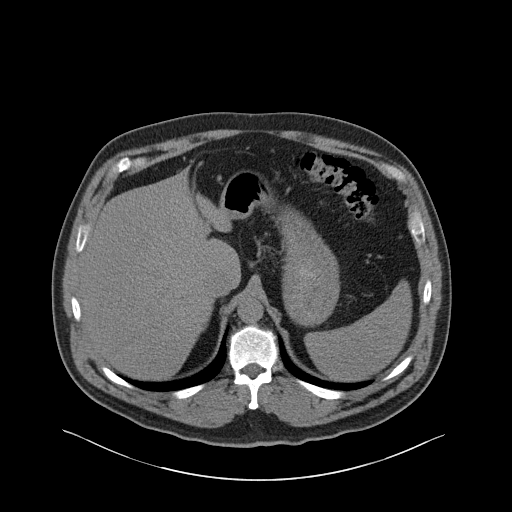
[im 100/114  soft-tissue]
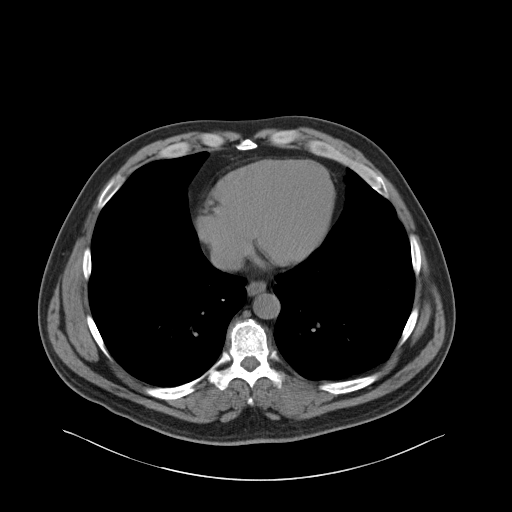
[im 107/114  soft-tissue]
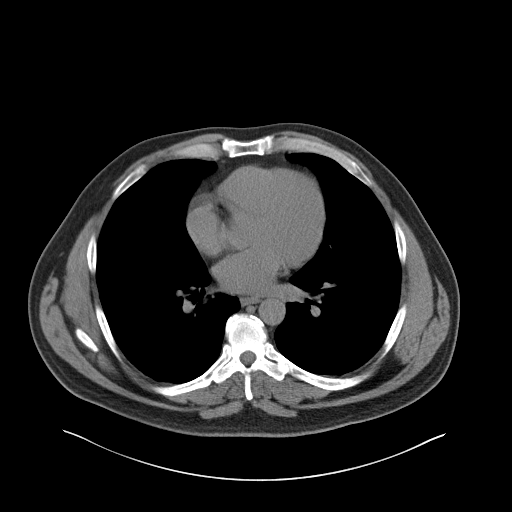

[Series 4: coronal · coronal · 0.95mm/px · 3 of 158 slices shown]
[im 53/158  soft-tissue]
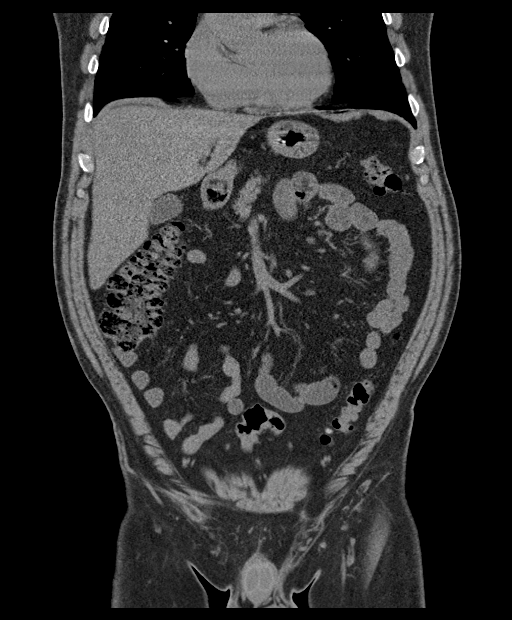
[im 70/158  soft-tissue]
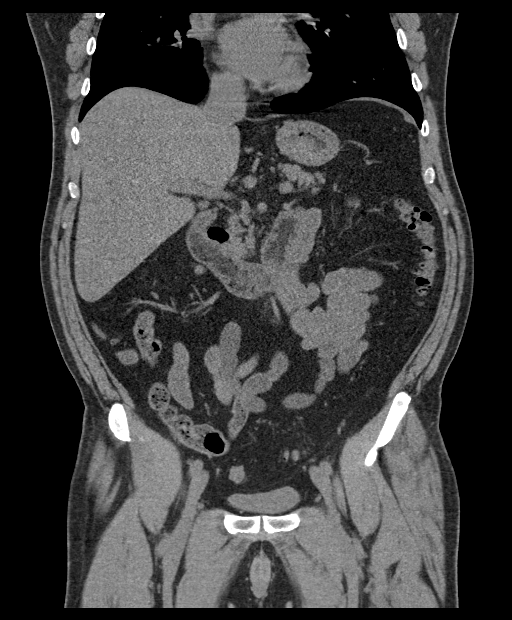
[im 88/158  soft-tissue]
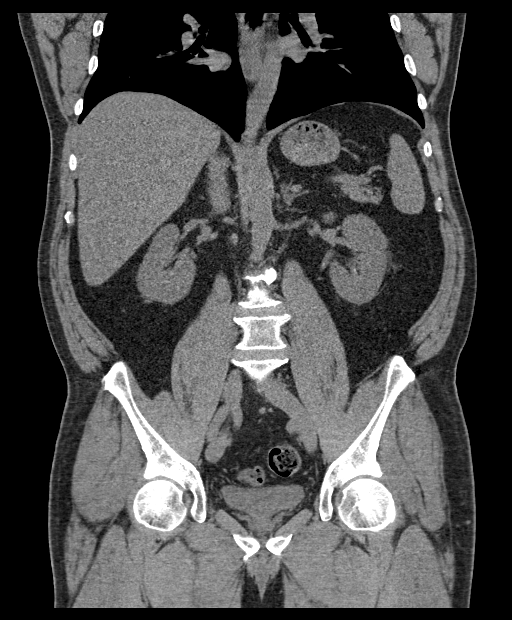

[15 of 46 positions shown; findings below may reference images not displayed]

FINDINGS: Lower chest: Unremarkable.

Hepatobiliary: Mild diffuse low attenuation throughout the hepatic
parenchyma, indicative of hepatic steatosis. No definite suspicious
cystic or solid hepatic lesions are confidently identified on
today's noncontrast CT examination. Gallbladder is normal in
appearance.

Pancreas: No definite pancreatic mass or peripancreatic fluid
collections or inflammatory changes are noted on today's noncontrast
CT examination.

Spleen: Unremarkable.

Adrenals/Urinary Tract: Multiple small nonobstructive calculi are
noted within the collecting systems of both kidneys, largest of
which measures 3 mm in the lower pole collecting system of the right
kidney. No additional calculi are noted along the course of either
ureter, or within the lumen of the urinary bladder. No
hydroureteronephrosis. Unenhanced appearance of the kidneys is
otherwise unremarkable. Bilateral adrenal glands are normal in
appearance. Urinary bladder is normal in appearance.

Stomach/Bowel: Unenhanced appearance of the stomach is normal. No
pathologic dilatation of small bowel or colon. Small duodenal
diverticulum extending off the medial aspect of the second portion
of the duodenum, without surrounding inflammatory changes to suggest
an associated diverticulitis at this time. Numerous colonic
diverticulae are noted, without surrounding phlegm [REDACTED] changes to
suggest an acute diverticulitis at this time. Normal appendix.

Vascular/Lymphatic: No atherosclerotic calcifications in the
abdominal aorta or pelvic vasculature. No lymphadenopathy noted in
the abdomen or pelvis.

Reproductive: Prostate gland and seminal vesicles are unremarkable
in appearance.

Other: No significant volume of ascites.  No pneumoperitoneum.

Musculoskeletal: There are no aggressive appearing lytic or blastic
lesions noted in the visualized portions of the skeleton.
IMPRESSION: 1. Multiple nonobstructive calculi in the collecting systems of both
kidneys measuring up to 3 mm in the lower pole collecting system of
the right kidney. No ureteral stones or findings of urinary tract
obstruction are noted at this time.
2. Hepatic steatosis.
3. Colonic diverticulosis without evidence of acute diverticulitis
at this time.

## 2019-11-01 MED ORDER — ONDANSETRON HCL 4 MG/2ML IJ SOLN
4.0000 mg | Freq: Once | INTRAMUSCULAR | Status: AC
  Administered 2019-11-01: 16:00:00 4 mg via INTRAVENOUS
  Filled 2019-11-01: qty 2

## 2019-11-01 MED ORDER — ONDANSETRON 4 MG PO TBDP: 4.0000 mg | ORAL_TABLET | Freq: Three times a day (TID) | ORAL | 0 refills | Status: DC | PRN

## 2019-11-01 MED ORDER — IBUPROFEN 600 MG PO TABS: 600.0000 mg | ORAL_TABLET | Freq: Four times a day (QID) | ORAL | 0 refills | Status: DC | PRN

## 2019-11-01 MED ORDER — HYDROMORPHONE HCL 1 MG/ML IJ SOLN
1.0000 mg | Freq: Once | INTRAMUSCULAR | Status: AC
  Administered 2019-11-01: 1 mg via INTRAVENOUS
  Filled 2019-11-01: qty 1

## 2019-11-01 MED ORDER — ONDANSETRON HCL 4 MG/2ML IJ SOLN
4.0000 mg | Freq: Once | INTRAMUSCULAR | Status: AC
  Administered 2019-11-01: 4 mg via INTRAVENOUS
  Filled 2019-11-01: qty 2

## 2019-11-01 MED ORDER — KETOROLAC TROMETHAMINE 30 MG/ML IJ SOLN
30.0000 mg | Freq: Once | INTRAMUSCULAR | Status: AC
  Administered 2019-11-01: 30 mg via INTRAVENOUS
  Filled 2019-11-01: qty 1

## 2019-11-01 MED ORDER — SODIUM CHLORIDE 0.9 % IV BOLUS
1000.0000 mL | Freq: Once | INTRAVENOUS | Status: AC
  Administered 2019-11-01: 1000 mL via INTRAVENOUS

## 2019-11-01 MED FILL — IBUPROFEN 600 MG TABLET: 600 | 8 days supply | Qty: 30 | Fill #0

## 2019-11-01 MED FILL — ONDANSETRON ODT 4 MG TABLET: 4 | 7 days supply | Qty: 20 | Fill #0

## 2019-11-01 NOTE — ED Provider Notes (Signed)
[facility] DEPT Provider Note   CSN: [id number] Arrival date & time: 11/01/19  1437     History Chief Complaint  Patient presents with  . Flank Pain    Brian Pierce is a 48 y.o. male.  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  Denies any fall or injury.  Pain is in his left flank and radiates to his left abdomen.  Associated with nausea but no vomiting.  Similar to previous kidney stone pain.  Denies any pain with urination or blood in the urine.  No fever.  No diarrhea.  No testicular pain.  Pain does not radiate down his legs.  There is no focal weakness, numbness, tingling.  No bowel or bladder incontinence.  No chest pain or shortness of breath  The history is provided by the patient.  Flank Pain Pertinent negatives include no chest pain, no abdominal pain, no headaches and no shortness of breath.       Past Medical History:  Diagnosis Date  . Arthritis   . Back pain    lower  . Dizziness   . Dizzy spells    " work injury hit with piece of steel feel dizzy and wavy feeling without pain hit with steel 2 months ago"  . Elevated liver enzymes 06/2018  . GERD (gastroesophageal reflux disease)   . Headache   . High cholesterol   . History of kidney stones   . Poor balance   . Prediabetes   . Umbilical hernia     Patient Active Problem List   Diagnosis Date Noted  . ALLERGIC RHINITIS 08/14/2008  . GERD 08/14/2008  . LIVER FUNCTION TESTS, ABNORMAL, HX OF 08/14/2008    Past Surgical History:  Procedure Laterality Date  . colonscopy and endoscopy  12 yrs ago  . HERNIA REPAIR Bilateral age 21   inguinal   . UMBILICAL HERNIA REPAIR N/A 01/10/2019   Procedure: LAPAROSCOPIC ASSISTED REPAIR OF UMBILICAL HERNIA WITH MESH ERAS PATHWAY;  Surgeon: Greer Pickerel, MD;  Location: [facility];  Service: General;  Laterality: N/A;  . wisdom teeth extraction         Family History  Problem Relation Age of  Onset  . Diabetes Father     Social History   Tobacco Use  . Smoking status: Current Every Day Smoker    Packs/day: 0.50    Types: Cigars  . Smokeless tobacco: Never Used  . Tobacco comment: light smoker 3 per day  Substance Use Topics  . Alcohol use: Yes    Comment: 4-5 drinks per day,   . Drug use: Yes    Types: Marijuana    Comment: marijuana last used Sep 08, 2018    Home Medications Prior to Admission medications   Medication Sig Start Date End Date Taking? Authorizing Provider  metFORMIN (GLUCOPHAGE) 500 MG tablet Take 500 mg by mouth 3 (three) times daily.    [provider]  omeprazole (PRILOSEC) 40 MG capsule Take 40 mg by mouth daily before breakfast.    [provider]    Allergies    Codone [hydrocodone]  Review of Systems   Review of Systems  Constitutional: Positive for activity change and appetite change. Negative for fatigue and fever.  HENT: Negative for congestion and rhinorrhea.   Eyes: Negative for visual disturbance.  Respiratory: Negative for chest tightness, shortness of breath and stridor.   Cardiovascular: Negative for chest pain.  Gastrointestinal: Positive  for nausea. Negative for abdominal pain and vomiting.  Genitourinary: Positive for flank pain. Negative for dysuria, hematuria and urgency.  Musculoskeletal: Positive for back pain.  Skin: Negative for rash.  Neurological: Negative for weakness and headaches.   all other systems are negative except as noted in the HPI and PMH.    Physical Exam Updated Vital Signs BP 134/64 (BP Location: Left Arm)   Pulse 70   Temp 97.6 F (36.4 C) (Oral)   Resp (!) 22   Ht 6\' 5"  (1.956 m)   Wt 110.5 kg   SpO2 99%   BMI 28.89 kg/m   Physical Exam Vitals and nursing note reviewed.  Constitutional:      General: He is not in acute distress.    Appearance: He is well-developed. He is obese.     Comments: uncomfortable  HENT:     Head: Normocephalic and atraumatic.      Mouth/Throat:     Pharynx: No oropharyngeal exudate.  Eyes:     Conjunctiva/sclera: Conjunctivae normal.     Pupils: Pupils are equal, round, and reactive to light.  Neck:     Comments: No meningismus. Cardiovascular:     Rate and Rhythm: Normal rate and regular rhythm.     Heart sounds: Normal heart sounds. No murmur.  Pulmonary:     Effort: Pulmonary effort is normal. No respiratory distress.     Breath sounds: Normal breath sounds.  Abdominal:     Palpations: Abdomen is soft.     Tenderness: There is no abdominal tenderness. There is no guarding or rebound.  Musculoskeletal:        General: Tenderness present. Normal range of motion.     Cervical back: Normal range of motion and neck supple.     Comments: Left CVA tenderness  Intact DP and PT pulses bilaterally  Skin:    General: Skin is warm.  Neurological:     Mental Status: He is alert and oriented to person, place, and time.     Cranial Nerves: No cranial nerve deficit.     Motor: No abnormal muscle tone.     Coordination: Coordination normal.     Comments:  5/5 strength throughout. CN 2-12 intact.Equal grip strength.   Psychiatric:        Behavior: Behavior normal.     ED Results / Procedures / Treatments   Labs (all labs ordered are listed, but only abnormal results are displayed) Labs Reviewed  URINALYSIS, ROUTINE W REFLEX MICROSCOPIC - Abnormal; Notable for the following components:      Result Value   Protein, ur 30 (*)    Leukocytes,Ua TRACE (*)    All other components within normal limits  CBC WITH DIFFERENTIAL/PLATELET - Abnormal; Notable for the following components:   WBC 10.7 (*)    Neutro Abs 8.2 (*)    All other components within normal limits  BASIC METABOLIC PANEL - Abnormal; Notable for the following components:   Glucose, Bld 155 (*)    BUN 25 (*)    All other components within normal limits  URINE CULTURE    EKG None  Radiology CT Renal Stone Study  Result Date:  11/01/2019 CLINICAL DATA:  48 year old male with history of flank pain. Suspected stone disease. EXAM: CT ABDOMEN AND PELVIS WITHOUT CONTRAST TECHNIQUE: Multidetector CT imaging of the abdomen and pelvis was performed following the standard protocol without IV contrast. COMPARISON:  CT the abdomen and pelvis 09/30/2016. FINDINGS: Lower chest: Unremarkable. Hepatobiliary: Mild diffuse low  attenuation throughout the hepatic parenchyma, indicative of hepatic steatosis. No definite suspicious cystic or solid hepatic lesions are confidently identified on today's noncontrast CT examination. Gallbladder is normal in appearance. Pancreas: No definite pancreatic mass or peripancreatic fluid collections or inflammatory changes are noted on today's noncontrast CT examination. Spleen: Unremarkable. Adrenals/Urinary Tract: Multiple small nonobstructive calculi are noted within the collecting systems of both kidneys, largest of which measures 3 mm in the lower pole collecting system of the right kidney. No additional calculi are noted along the course of either ureter, or within the lumen of the urinary bladder. No hydroureteronephrosis. Unenhanced appearance of the kidneys is otherwise unremarkable. Bilateral adrenal glands are normal in appearance. Urinary bladder is normal in appearance. Stomach/Bowel: Unenhanced appearance of the stomach is normal. No pathologic dilatation of small bowel or colon. Small duodenal diverticulum extending off the medial aspect of the second portion of the duodenum, without surrounding inflammatory changes to suggest an associated diverticulitis at this time. Numerous colonic diverticulae are noted, without surrounding phlegm a tori changes to suggest an acute diverticulitis at this time. Normal appendix. Vascular/Lymphatic: No atherosclerotic calcifications in the abdominal aorta or pelvic vasculature. No lymphadenopathy noted in the abdomen or pelvis. Reproductive: Prostate gland and seminal  vesicles are unremarkable in appearance. Other: No significant volume of ascites.  No pneumoperitoneum. Musculoskeletal: There are no aggressive appearing lytic or blastic lesions noted in the visualized portions of the skeleton. IMPRESSION: 1. Multiple nonobstructive calculi in the collecting systems of both kidneys measuring up to 3 mm in the lower pole collecting system of the right kidney. No ureteral stones or findings of urinary tract obstruction are noted at this time. 2. Hepatic steatosis. 3. Colonic diverticulosis without evidence of acute diverticulitis at this time. Electronically Signed   By: Vinnie Langton M.D.   On: 11/01/2019 16:31    Procedures Procedures (including critical care time)  Medications Ordered in ED Medications  sodium chloride 0.9 % bolus 1,000 mL (has no administration in time range)  ondansetron (ZOFRAN) injection 4 mg (has no administration in time range)  HYDROmorphone (DILAUDID) injection 1 mg (has no administration in time range)  ketorolac (TORADOL) 30 MG/ML injection 30 mg (has no administration in time range)    ED Course  I have reviewed the triage vital signs and the nursing notes.  Pertinent labs & imaging results that were available during my care of the patient were reviewed by me and considered in my medical decision making (see chart for details).    MDM Rules/Calculators/A&P                      Flank pain with nausea similar to previous kidney stones.  Vitals are stable, no fever  Patient given IV fluids, pain and nausea control.  Intact DP and PT pulses.  Equal strength, sensation and reflexes.  Low suspicion for compression or cauda equina  UA with pyuria, no infection. Will send culture.   Shows multiple kidney stones but no ureteral stones.  No hydronephrosis.  Creatinine is normal.  Patient resting comfortably.  No AAA.  Anticipate patient be able to be discharged home when his pain and nausea are controlled.   Final Clinical  Impression(s) / ED Diagnoses Final diagnoses:  Left flank pain    Rx / DC Orders ED Discharge Orders    None       Cheetara Hoge, Annie Main, MD 11/01/19 1711

## 2019-11-01 NOTE — ED Triage Notes (Signed)
Patient reports left flank pain x1 hour. Hx kidney stone. Denies urinary sx.

## 2019-11-01 NOTE — ED Notes (Signed)
Assisted pt to restroom to obtain urine sample.

## 2019-11-01 NOTE — ED Notes (Signed)
Pt states he is having left flank pain that is non  Radiating. Pt states pain feels familiar to similar pain hes had in the past when he had a kidney stone, approximately two years ago. Pt denies any fever or urinary symptoms.

## 2019-11-01 NOTE — Discharge Instructions (Signed)
As we discussed, you may have passed a kidney stone.  Your CT scan shows several stones still in your kidney.  Take the pain medication as prescribed and follow-up with your doctor as well as the urologist.  Return to the ED with worsening pain, fever, vomiting, inability urinate or any other concerns.

## 2019-11-06 DIAGNOSIS — N200 Calculus of kidney: Secondary | ICD-10-CM | POA: Diagnosis not present

## 2019-11-06 MED FILL — traMADol HCL 50 MG TABS: 50 | 2 days supply | Qty: 10 | Fill #0

## 2020-01-15 ENCOUNTER — Ambulatory Visit: Payer: 59 | Attending: Internal Medicine

## 2020-01-15 DIAGNOSIS — Z20822 Contact with and (suspected) exposure to covid-19: Secondary | ICD-10-CM

## 2020-01-16 LAB — NOVEL CORONAVIRUS, NAA

## 2020-01-17 ENCOUNTER — Ambulatory Visit: Payer: 59 | Attending: Internal Medicine

## 2020-01-17 ENCOUNTER — Telehealth: Payer: Self-pay | Admitting: *Deleted

## 2020-01-17 DIAGNOSIS — Z20822 Contact with and (suspected) exposure to covid-19: Secondary | ICD-10-CM

## 2020-01-17 NOTE — Telephone Encounter (Signed)
Patient called for results ,still pending . 

## 2020-01-18 LAB — NOVEL CORONAVIRUS, NAA: SARS-CoV-2, NAA: NOT DETECTED

## 2020-05-01 DIAGNOSIS — Z125 Encounter for screening for malignant neoplasm of prostate: Secondary | ICD-10-CM | POA: Diagnosis not present

## 2020-05-01 DIAGNOSIS — E1165 Type 2 diabetes mellitus with hyperglycemia: Secondary | ICD-10-CM | POA: Diagnosis not present

## 2020-05-01 DIAGNOSIS — Z0000 Encounter for general adult medical examination without abnormal findings: Secondary | ICD-10-CM | POA: Diagnosis not present

## 2020-05-02 DIAGNOSIS — Z0000 Encounter for general adult medical examination without abnormal findings: Secondary | ICD-10-CM | POA: Diagnosis not present

## 2020-05-02 DIAGNOSIS — R7309 Other abnormal glucose: Secondary | ICD-10-CM | POA: Diagnosis not present

## 2020-05-24 ENCOUNTER — Encounter: Payer: Self-pay | Admitting: Gastroenterology

## 2020-06-14 ENCOUNTER — Encounter: Payer: Self-pay | Admitting: *Deleted

## 2020-06-14 ENCOUNTER — Telehealth: Payer: Self-pay | Admitting: *Deleted

## 2020-06-14 DIAGNOSIS — T884XXA Failed or difficult intubation, initial encounter: Secondary | ICD-10-CM | POA: Insufficient documentation

## 2020-06-14 NOTE — Telephone Encounter (Signed)
Brian Pierce looks like this patient has a difficult airway, assuming not appropriate for [facility] but can you review anesthesia records to confirm.  [name] if he is a direct book for the hospital I have no openings at the hospital for cases until November. Unless he is requesting me specifically not sure if you want to see if other physicians have openings at the hospital sooner to accommodate him. Thanks

## 2020-06-14 NOTE — Telephone Encounter (Signed)
Dr Havery Moros,  This pt has no GI hx with [facility]- he is a direct screening colon scheduled with you on 07-26-20 Friday -  [employer] Employee - He had a surgery 01-10-2019 with a difficult intubation- notes states=  Difficult Airway- due to anterior larynx Comments: Easy mask with oral airway. DL X 1 with MAC 4. Grade 3-4 view. - ETCO2. ETT removed. Sats 100%. Masked with ease. DL X 1 with Mil 2. Grade 3 view. ATOI. + ETCO2. BBS=   Do you want him to have an OV with you prior to a colon procedure or just direct at [facility]?  Med hx includes- anxiety, back pain, asthmatic bronchitis, chronic cough, OA and difficult intubation   Please advise, Thanks, Lelan Pons

## 2020-06-17 NOTE — Telephone Encounter (Signed)
Dr. Havery Moros,  After his last operative procedure Dr. Linna Caprice made him a difficult intubation so his procedure will need to be done at the hospital.  Thanks,  Brian Pierce

## 2020-06-17 NOTE — Telephone Encounter (Signed)
Barbera Setters-    I spoke to Mr Audino this afternoon and explained the need for [facility] case- he is okay to go on Dr Doyne Keel wait list for November 2021-  I canceled his [place] colon in August- thanks    Lelan Pons

## 2020-06-17 NOTE — Telephone Encounter (Signed)
Left message for pt to return a call to me- Brian Pierce PV

## 2020-06-17 NOTE — Telephone Encounter (Signed)
Noted  

## 2020-07-21 ENCOUNTER — Other Ambulatory Visit: Payer: Self-pay

## 2020-07-21 DIAGNOSIS — Z1211 Encounter for screening for malignant neoplasm of colon: Secondary | ICD-10-CM

## 2020-07-21 NOTE — Progress Notes (Signed)
Colon cancer screening with [facility] on 09-10-20 at [facility]

## 2020-07-22 ENCOUNTER — Telehealth: Payer: Self-pay

## 2020-07-22 NOTE — Telephone Encounter (Signed)
Called patient but his VM is full.  Sent him the following MyChart message:  Hello.  I hope you are doing well.  I have tried to call you but I am unable to leave you a message as your voice mail is full.  I would like to see if we can get you scheduled for your colonoscopy. Dr. Doyne Keel next availability at [facility] is on Tuesday morning, November 2nd.  Would that work for you?  You would need to have someone drive you, stay with you and drive you home.  You would need to be tested for Covid 19 on Friday, 10-29 at our drive through facility. You could let me know what time would work best for you that day.  Please let me know as soon as possible if you would like to take this spot.  If so, we will schedule you for a nurse appointment at our office to go over the instructions for prepping for the procedure and your Covid test on 10-29.  Thank you, Randel Books, CMA

## 2020-07-23 ENCOUNTER — Other Ambulatory Visit: Payer: Self-pay

## 2020-07-23 DIAGNOSIS — Z1211 Encounter for screening for malignant neoplasm of colon: Secondary | ICD-10-CM

## 2020-07-24 ENCOUNTER — Telehealth: Payer: Self-pay

## 2020-07-24 ENCOUNTER — Other Ambulatory Visit: Payer: Self-pay

## 2020-07-24 NOTE — Telephone Encounter (Signed)
Brian Pierce has scheduled pt for 10:45am, would need to arrive at 9:15am.

## 2020-07-24 NOTE — Telephone Encounter (Signed)
-----   Message from Yetta Flock, MD sent at 07/23/2020 10:13 PM EDT ----- Regarding: RE: Hospital Colon Thanks for asking [facility], yes okay to use that slot for him. Thanks  Jan FYI ----- Message ----- From: Yevette Edwards, RN Sent: 07/23/2020   9:43 AM EDT To: Yetta Flock, MD Subject: Hospital Colon                                 Hi Dr. Havery Moros, I put this patient on at 11:45 am on 11/2 at [facility] for screening colon. You are at [facility] that morning then office at 1:20. Is this ok, or would you like me to hold off on scheduling him? Thanks

## 2020-07-26 ENCOUNTER — Encounter: Payer: 59 | Admitting: Gastroenterology

## 2020-07-29 NOTE — Telephone Encounter (Signed)
Lm on patient's voicemail to return call for upcoming colonoscopy appointment information

## 2020-07-30 ENCOUNTER — Telehealth: Payer: Self-pay | Admitting: Gastroenterology

## 2020-07-30 NOTE — Telephone Encounter (Signed)
Jan, Manning with patient, he confirmed that 09/10/20 for procedure will work. Pt is scheduled for a pre visit on 09/02/20 at 9 am. COVID test is scheduled on 09/06/20 at 8:30 AM. Called into [facility] to place patient on the list, provided patient with the address to [facility] testing site.

## 2020-09-02 ENCOUNTER — Other Ambulatory Visit: Payer: Self-pay | Admitting: Gastroenterology

## 2020-09-02 ENCOUNTER — Other Ambulatory Visit: Payer: Self-pay

## 2020-09-02 ENCOUNTER — Ambulatory Visit (AMBULATORY_SURGERY_CENTER): Payer: Self-pay

## 2020-09-02 ENCOUNTER — Encounter: Payer: Self-pay | Admitting: Gastroenterology

## 2020-09-02 VITALS — Ht 72.0 in | Wt 253.2 lb

## 2020-09-02 DIAGNOSIS — Z1211 Encounter for screening for malignant neoplasm of colon: Secondary | ICD-10-CM

## 2020-09-02 MED ORDER — NA SULFATE-K SULFATE-MG SULF 17.5-3.13-1.6 GM/177ML PO SOLN: 1.0000 | Freq: Once | ORAL | 0 refills | Status: DC

## 2020-09-02 MED FILL — SUPREP BOWEL PREP KIT: 17.5-3.13-1 | 1 days supply | Qty: 354 | Fill #0

## 2020-09-02 NOTE — Progress Notes (Signed)
No allergies to soy or egg Pt is not on blood thinners or diet pills Denies issues with sedation, pt is a difficult intubation and will be having procedure at [facility] Denies atrial flutter/fib Denies constipation   Emmi instructions given to pt  Pt is aware of Covid safety and care partner requirements.

## 2020-09-06 ENCOUNTER — Other Ambulatory Visit (HOSPITAL_COMMUNITY)
Admission: RE | Admit: 2020-09-06 | Discharge: 2020-09-06 | Disposition: A | Discharge status: Home / Self Care | Payer: 59 | Source: Ambulatory Visit | Attending: Gastroenterology | Admitting: Gastroenterology

## 2020-09-06 DIAGNOSIS — Z20822 Contact with and (suspected) exposure to covid-19: Secondary | ICD-10-CM | POA: Diagnosis not present

## 2020-09-06 DIAGNOSIS — Z01812 Encounter for preprocedural laboratory examination: Secondary | ICD-10-CM | POA: Diagnosis not present

## 2020-09-06 LAB — SARS CORONAVIRUS 2 (TAT 6-24 HRS): SARS Coronavirus 2: NEGATIVE

## 2020-09-10 ENCOUNTER — Other Ambulatory Visit: Payer: Self-pay

## 2020-09-10 ENCOUNTER — Ambulatory Visit (HOSPITAL_COMMUNITY): Payer: 59

## 2020-09-10 ENCOUNTER — Encounter (HOSPITAL_COMMUNITY): Payer: Self-pay | Admitting: Gastroenterology

## 2020-09-10 ENCOUNTER — Ambulatory Visit (HOSPITAL_COMMUNITY)
Admission: RE | Admit: 2020-09-10 | Discharge: 2020-09-10 | Disposition: A | Discharge status: Home / Self Care | Payer: 59 | Attending: Gastroenterology | Admitting: Gastroenterology

## 2020-09-10 ENCOUNTER — Encounter (HOSPITAL_COMMUNITY)
Admission: RE | Disposition: A | Discharge status: Home / Self Care | Payer: Self-pay | Source: Home / Self Care | Attending: Gastroenterology

## 2020-09-10 DIAGNOSIS — K5730 Diverticulosis of large intestine without perforation or abscess without bleeding: Secondary | ICD-10-CM | POA: Diagnosis not present

## 2020-09-10 DIAGNOSIS — Z1211 Encounter for screening for malignant neoplasm of colon: Secondary | ICD-10-CM | POA: Diagnosis not present

## 2020-09-10 DIAGNOSIS — K648 Other hemorrhoids: Secondary | ICD-10-CM | POA: Insufficient documentation

## 2020-09-10 DIAGNOSIS — D122 Benign neoplasm of ascending colon: Secondary | ICD-10-CM | POA: Diagnosis not present

## 2020-09-10 DIAGNOSIS — D126 Benign neoplasm of colon, unspecified: Secondary | ICD-10-CM

## 2020-09-10 DIAGNOSIS — K635 Polyp of colon: Secondary | ICD-10-CM | POA: Insufficient documentation

## 2020-09-10 DIAGNOSIS — D123 Benign neoplasm of transverse colon: Secondary | ICD-10-CM | POA: Diagnosis not present

## 2020-09-10 DIAGNOSIS — D120 Benign neoplasm of cecum: Secondary | ICD-10-CM | POA: Diagnosis not present

## 2020-09-10 HISTORY — PX: COLONOSCOPY WITH PROPOFOL: SHX5780

## 2020-09-10 HISTORY — PX: POLYPECTOMY: SHX5525

## 2020-09-10 SURGERY — COLONOSCOPY WITH PROPOFOL
Anesthesia: Monitor Anesthesia Care

## 2020-09-10 MED ORDER — PROPOFOL 10 MG/ML IV BOLUS
INTRAVENOUS | Status: DC | PRN
  Administered 2020-09-10 (×2): 20 mg via INTRAVENOUS

## 2020-09-10 MED ORDER — PROPOFOL 500 MG/50ML IV EMUL
INTRAVENOUS | Status: DC | PRN
  Administered 2020-09-10: 125 ug/kg/min via INTRAVENOUS

## 2020-09-10 MED ORDER — LIDOCAINE HCL (CARDIAC) PF 100 MG/5ML IV SOSY
PREFILLED_SYRINGE | INTRAVENOUS | Status: DC | PRN
  Administered 2020-09-10: 100 mg via INTRAVENOUS

## 2020-09-10 MED ORDER — SODIUM CHLORIDE 0.9 % IV SOLN: INTRAVENOUS | Status: DC

## 2020-09-10 MED ORDER — PROPOFOL 10 MG/ML IV BOLUS
INTRAVENOUS | Status: AC
  Filled 2020-09-10: qty 20

## 2020-09-10 MED ORDER — LACTATED RINGERS IV SOLN: INTRAVENOUS | Status: DC

## 2020-09-10 SURGICAL SUPPLY — 21 items

## 2020-09-10 NOTE — Interval H&P Note (Signed)
History and Physical Interval Note:  09/10/2020 10:27 AM  Brian Pierce  has presented today for surgery, with the diagnosis of colon cancer screening.  The various methods of treatment have been discussed with the patient and family. After consideration of risks, benefits and other options for treatment, the patient has consented to  Procedure(s): COLONOSCOPY WITH PROPOFOL (N/A) as a surgical intervention.  The patient's history has been reviewed, patient examined, no change in status, stable for surgery.  I have reviewed the patient's chart and labs.  Questions were answered to the patient's satisfaction.     [place]

## 2020-09-10 NOTE — Anesthesia Preprocedure Evaluation (Signed)
Anesthesia Evaluation  Patient identified by MRN, date of birth, ID band Patient awake    Reviewed: Allergy & Precautions, NPO status , Patient's Chart, lab work & pertinent test results  Airway Mallampati: III  TM Distance: >3 FB Neck ROM: Full    Dental no notable dental hx. (+) Teeth Intact, Dental Advisory Given   Pulmonary Current Smoker,  3/4ppd x 5 years No inhalers   Pulmonary exam normal breath sounds clear to auscultation       Cardiovascular negative cardio ROS Normal cardiovascular exam Rhythm:Regular Rate:Normal     Neuro/Psych  Headaches, negative psych ROS   GI/Hepatic GERD  Medicated and Controlled,(+)     substance abuse  alcohol use and marijuana use, CRC screening   Endo/Other  Obesity BMI 34 Pre-diabetic  Renal/GU negative Renal ROS  negative genitourinary   Musculoskeletal  (+) Arthritis , Osteoarthritis,    Abdominal (+) + obese,   Peds negative pediatric ROS (+)  Hematology negative hematology ROS (+)   Anesthesia Other Findings   Reproductive/Obstetrics negative OB ROS                             Anesthesia Physical Anesthesia Plan  ASA: II  Anesthesia Plan: MAC   Post-op Pain Management:    Induction:   PONV Risk Score and Plan: 2 and Propofol infusion and TIVA  Airway Management Planned: Natural Airway and Simple Face Mask  Additional Equipment: None  Intra-op Plan:   Post-operative Plan:   Informed Consent: I have reviewed the patients History and Physical, chart, labs and discussed the procedure including the risks, benefits and alternatives for the proposed anesthesia with the patient or authorized representative who has indicated his/her understanding and acceptance.       Plan Discussed with: CRNA  Anesthesia Plan Comments: (Labeled difficult airway during hernia surgery in 2020 -2 attempts with direct laryngoscopy, successful on  2nd attempt with miller blade but still grade 3 view. Will have glidescope available)        Anesthesia Quick Evaluation

## 2020-09-10 NOTE — H&P (Signed)
HPI:   Brian Pierce is a 49 y.o. male here for a screening colonoscopy. No FH of CRC. No bowel symptoms. He has a history of reported difficult airway, case being done in the hospital for anesthesia support. He denies any complaints otherwise, feels at baseline.    Past Medical History:  Diagnosis Date  . Arthritis   . Back pain    lower  . Dizziness   . Dizzy spells    " work injury hit with piece of steel feel dizzy and wavy feeling without pain hit with steel 2 months ago"  . Elevated liver enzymes 06/2018  . GERD (gastroesophageal reflux disease)   . Headache   . High cholesterol   . History of kidney stones   . Poor balance   . Prediabetes   . Substance abuse ([place])    Drinking struggles  . Umbilical hernia     Past Surgical History:  Procedure Laterality Date  . COLONOSCOPY     over 10 yrs  . colonscopy and endoscopy  12 yrs ago  . HERNIA REPAIR Bilateral age 34   inguinal   . UMBILICAL HERNIA REPAIR N/A 01/10/2019   Procedure: LAPAROSCOPIC ASSISTED REPAIR OF UMBILICAL HERNIA WITH MESH ERAS PATHWAY;  Surgeon: Greer Pickerel, MD;  Location: [facility];  Service: General;  Laterality: N/A;  . UPPER GASTROINTESTINAL ENDOSCOPY     over 10 yrs  . wisdom teeth extraction      Family History  Problem Relation Age of Onset  . Diabetes Father   . Colon cancer Neg Hx   . Colon polyps Neg Hx   . Esophageal cancer Neg Hx   . Rectal cancer Neg Hx   . Prostate cancer Neg Hx      Social History   Tobacco Use  . Smoking status: Current Every Day Smoker    Packs/day: 0.50    Types: Cigars  . Smokeless tobacco: Never Used  . Tobacco comment: light smoker 3 per day  Vaping Use  . Vaping Use: Never used  Substance Use Topics  . Alcohol use: Yes    Comment: 4-5 drinks per day,   . Drug use: Yes    Types: Marijuana    Comment: marijuana last used Sep 08, 2018    Prior to Admission medications   Medication Sig Start Date End Date Taking? Authorizing Provider   ibuprofen (ADVIL) 200 MG tablet Take 400 mg by mouth every 8 (eight) hours as needed (for pain.).   Yes [provider]  omeprazole (PRILOSEC) 40 MG capsule Take 40 mg by mouth daily before breakfast.   Yes [provider]    Current Facility-Administered Medications  Medication Dose Route Frequency Provider Last Rate Last Admin  . lactated ringers infusion   Intravenous Continuous Brisa Auth, Carlota Raspberry, MD 20 mL/hr at 09/10/20 1005 Restarted at 09/10/20 1008   Facility-Administered Medications Ordered in Other Encounters  Medication Dose Route Frequency Provider Last Rate Last Admin  . lidocaine (cardiac) 100 mg/51mL (XYLOCAINE) injection 2%   Intravenous Anesthesia Intra-op Gwyndolyn Saxon, CRNA   100 mg at 09/10/20 1009  . propofol (DIPRIVAN) 10 mg/mL bolus/IV push   Intravenous Anesthesia Intra-op Gwyndolyn Saxon, CRNA   20 mg at 09/10/20 1024  . propofol (DIPRIVAN) 500 MG/50ML infusion   Intravenous Continuous PRN Gwyndolyn Saxon, CRNA 102.06 mL/hr at 09/10/20 1024 150 mcg/kg/min at 09/10/20 1024    Allergies as of  07/23/2020 - Review Complete 11/01/2019  Allergen Reaction Noted  . Codone [hydrocodone] Itching 09/30/2016     Review of Systems:    As per HPI, otherwise negative    Physical Exam:  Vital signs in last 24 hours: Temp:  [98.2 F (36.8 C)] 98.2 F (36.8 C) (11/02 0934) Pulse Rate:  [68] 68 (11/02 0934) Resp:  [16] 16 (11/02 0934) BP: (141)/(93) 141/93 (11/02 0934) SpO2:  [99 %] 99 % (11/02 0934) Weight:  [113.4 kg] 113.4 kg (11/02 0934)   General:   Pleasant male in NAD Lungs:  Respirations even and unlabored. Lungs clear to auscultation bilaterally.    Heart:  Regular rate and rhythm; no MRG Abdomen:  Soft, nondistended, nontender.  Extremities:  Without edema. Neurologic:  Alert and  oriented x4;  grossly normal neurologically. Skin:  Intact without significant lesions or rashes. Psych:  Alert and cooperative. Normal  affect.  Lab Results  Component Value Date   WBC 10.7 (H) 11/01/2019   HGB 16.4 11/01/2019   HCT 47.9 11/01/2019   MCV 89.0 11/01/2019   PLT 189 11/01/2019    Lab Results  Component Value Date   CREATININE 1.00 11/01/2019   BUN 25 (H) 11/01/2019   NA 138 11/01/2019   K 3.9 11/01/2019   CL 106 11/01/2019   CO2 22 11/01/2019      Impression / Plan:   49 y/o male here for screening colonoscopy. No bowel symptoms or alarm symptoms. Has a history of having a difficult airway so his case is being done at the hospital. I have discussed risks / benefits of colonoscopy and anesthesia with him and he wants to proceed. Further recommendations pending the results.  Kingston Cellar, MD [facility]

## 2020-09-10 NOTE — Anesthesia Postprocedure Evaluation (Signed)
Anesthesia Post Note  Patient: Brian Pierce  Procedure(s) Performed: COLONOSCOPY WITH PROPOFOL (N/A ) POLYPECTOMY     Patient location during evaluation: PACU Anesthesia Type: MAC Level of consciousness: awake and alert Pain management: pain level controlled Vital Signs Assessment: post-procedure vital signs reviewed and stable Respiratory status: spontaneous breathing, nonlabored ventilation and respiratory function stable Cardiovascular status: blood pressure returned to baseline and stable Postop Assessment: no apparent nausea or vomiting Anesthetic complications: no   No complications documented.  Last Vitals:  Vitals:   09/10/20 1056 09/10/20 1100  BP: 104/67 117/73  Pulse: 68 72  Resp: 10 (!) 22  Temp: 36.6 C   SpO2: 97% 95%    Last Pain:  Vitals:   09/10/20 1100  TempSrc:   PainSc: 0-No pain                 Pervis Hocking

## 2020-09-10 NOTE — Transfer of Care (Signed)
Immediate Anesthesia Transfer of Care Note  Patient: YOBANY VROOM  Procedure(s) Performed: COLONOSCOPY WITH PROPOFOL (N/A ) POLYPECTOMY  Patient Location: PACU  Anesthesia Type:MAC  Level of Consciousness: drowsy  Airway & Oxygen Therapy: Patient Spontanous Breathing and Patient connected to face mask oxygen  Post-op Assessment: Report given to RN and Post -op Vital signs reviewed and stable  Post vital signs: Reviewed and stable  Last Vitals:  Vitals Value Taken Time  BP 104/67 09/10/20 1056  Temp    Pulse 67 09/10/20 1058  Resp 11 09/10/20 1058  SpO2 97 % 09/10/20 1058  Vitals shown include unvalidated device data.  Last Pain:  Vitals:   09/10/20 0934  TempSrc: Oral  PainSc: 0-No pain         Complications: No complications documented.

## 2020-09-10 NOTE — Discharge Instructions (Signed)

## 2020-09-10 NOTE — Op Note (Signed)
[facility] Patient Name: Brian Pierce Procedure Date: 09/10/2020 MRN: [id number] Attending MD: Carlota Raspberry. Havery Moros , MD Date of Birth: October 30, 1971 CSN: [id number] Age: 49 Admit Type: Outpatient Procedure:                Colonoscopy Indications:              Screening for colorectal malignant neoplasm Providers:                Remo Lipps P. Havery Moros, MD, Josie Dixon, RN,                            Particia Nearing, RN, Laverda Sorenson, Technician,                            Nicholes Calamity, CRNA Referring MD:              Medicines:                Monitored Anesthesia Care Complications:            No immediate complications. Estimated blood loss:                            Minimal. Estimated Blood Loss:     Estimated blood loss was minimal. Procedure:                Pre-Anesthesia Assessment:                           - Prior to the procedure, a History and Physical                            was performed, and patient medications and                            allergies were reviewed. The patient's tolerance of                            previous anesthesia was also reviewed. The risks                            and benefits of the procedure and the sedation                            options and risks were discussed with the patient.                            All questions were answered, and informed consent                            was obtained. Prior Anticoagulants: The patient has                            taken no previous anticoagulant or antiplatelet                            agents. ASA Grade  Assessment: II - A patient with                            mild systemic disease. After reviewing the risks                            and benefits, the patient was deemed in                            satisfactory condition to undergo the procedure.                           After obtaining informed consent, the colonoscope                            was passed  under direct vision. Throughout the                            procedure, the patient's blood pressure, pulse, and                            oxygen saturations were monitored continuously. The                            CF-HQ190L ([id number]) Olympus colonoscope was                            introduced through the anus and advanced to the the                            terminal ileum, with identification of the                            appendiceal orifice and IC valve. The colonoscopy                            was performed without difficulty. The patient                            tolerated the procedure well. The quality of the                            bowel preparation was good. The ileocecal valve,                            appendiceal orifice, and rectum were photographed. Scope In: 10:28:04 AM Scope Out: 10:51:29 AM Scope Withdrawal Time: 0 hours 20 minutes 58 seconds  Total Procedure Duration: 0 hours 23 minutes 25 seconds  Findings:      The perianal and digital rectal examinations were normal.      The terminal ileum appeared normal.      A 10 mm polyp was found in the ileocecal valve. The polyp was sessile.       The polyp was removed with a cold snare. Resection and retrieval were  complete.      A 5 mm polyp was found in the ascending colon. The polyp was sessile.       The polyp was removed with a cold snare. Resection and retrieval were       complete.      A 4 mm polyp was found in the splenic flexure. The polyp was sessile.       The polyp was removed with a cold snare. Resection and retrieval were       complete.      Multiple small-mouthed diverticula were found in the ascending colon.      Internal hemorrhoids were found during retroflexion. The hemorrhoids       were small.      The exam was otherwise without abnormality. Impression:               - The examined portion of the ileum was normal.                           - One 10 mm polyp at the ileocecal  valve, removed                            with a cold snare. Resected and retrieved.                           - One 5 mm polyp in the ascending colon, removed                            with a cold snare. Resected and retrieved.                           - One 4 mm polyp at the splenic flexure, removed                            with a cold snare. Resected and retrieved.                           - Diverticulosis in the ascending colon.                           - Internal hemorrhoids.                           - The examination was otherwise normal. Moderate Sedation:      No moderate sedation, case performed with MAC Recommendation:           - Patient has a contact number available for                            emergencies. The signs and symptoms of potential                            delayed complications were discussed with the                            patient. Return to normal activities tomorrow.  Written discharge instructions were provided to the                            patient.                           - Resume previous diet.                           - Continue present medications.                           - Await pathology results. Procedure Code(s):        --- Professional ---                           [phone], Colonoscopy, flexible; with removal of                            tumor(s), polyp(s), or other lesion(s) by snare                            technique Diagnosis Code(s):        --- Professional ---                           Z12.11, Encounter for screening for malignant                            neoplasm of colon                           K64.8, Other hemorrhoids                           K63.5, Polyp of colon                           K57.30, Diverticulosis of large intestine without                            perforation or abscess without bleeding CPT copyright 2019 American Medical Association. All rights reserved. The codes  documented in this report are preliminary and upon coder review may  be revised to meet current compliance requirements. Remo Lipps P. Tameko Halder, MD 09/10/2020 10:57:50 AM This report has been signed electronically. Number of Addenda: 0

## 2020-09-11 ENCOUNTER — Encounter (HOSPITAL_COMMUNITY): Payer: Self-pay | Admitting: Gastroenterology

## 2020-09-11 LAB — SURGICAL PATHOLOGY

## 2020-10-08 MED FILL — OMEPRAZOLE DR 20 MG CAPSULE: 20 | 90 days supply | Qty: 90 | Fill #1

## 2020-10-30 DIAGNOSIS — R7309 Other abnormal glucose: Secondary | ICD-10-CM | POA: Diagnosis not present

## 2021-02-28 ENCOUNTER — Other Ambulatory Visit (HOSPITAL_COMMUNITY): Payer: Self-pay

## 2021-02-28 MED ORDER — FLUTICASONE PROPIONATE 50 MCG/ACT NA SUSP
1.0000 | Freq: Every day | NASAL | 3 refills | Status: DC
Start: 2021-02-28 — End: 2022-01-12
  Filled 2021-02-28: qty 16, 60d supply, fill #0
  Filled 2021-07-08: qty 16, 60d supply, fill #1

## 2021-02-28 MED ORDER — ZYRTEC ALLERGY 10 MG PO CAPS: 10.0000 mg | ORAL_CAPSULE | Freq: Every day | ORAL | 3 refills | Status: DC

## 2021-07-08 ENCOUNTER — Other Ambulatory Visit (HOSPITAL_COMMUNITY): Payer: Self-pay

## 2021-11-28 ENCOUNTER — Other Ambulatory Visit (HOSPITAL_COMMUNITY): Payer: Self-pay

## 2021-11-28 DIAGNOSIS — R0989 Other specified symptoms and signs involving the circulatory and respiratory systems: Secondary | ICD-10-CM | POA: Diagnosis not present

## 2021-11-28 DIAGNOSIS — R051 Acute cough: Secondary | ICD-10-CM | POA: Diagnosis not present

## 2021-11-28 MED ORDER — AZITHROMYCIN 250 MG PO TABS
ORAL_TABLET | ORAL | 0 refills | Status: DC
  Filled 2021-11-28: qty 6, 5d supply, fill #0

## 2022-01-04 DIAGNOSIS — R4182 Altered mental status, unspecified: Secondary | ICD-10-CM | POA: Diagnosis not present

## 2022-01-04 DIAGNOSIS — R569 Unspecified convulsions: Secondary | ICD-10-CM | POA: Insufficient documentation

## 2022-01-04 DIAGNOSIS — I952 Hypotension due to drugs: Secondary | ICD-10-CM | POA: Diagnosis not present

## 2022-01-04 DIAGNOSIS — N179 Acute kidney failure, unspecified: Secondary | ICD-10-CM | POA: Diagnosis not present

## 2022-01-04 DIAGNOSIS — J690 Pneumonitis due to inhalation of food and vomit: Secondary | ICD-10-CM | POA: Diagnosis not present

## 2022-01-04 DIAGNOSIS — I639 Cerebral infarction, unspecified: Secondary | ICD-10-CM | POA: Diagnosis not present

## 2022-01-04 DIAGNOSIS — R825 Elevated urine levels of drugs, medicaments and biological substances: Secondary | ICD-10-CM | POA: Diagnosis not present

## 2022-01-04 DIAGNOSIS — T17490A Other foreign object in trachea causing asphyxiation, initial encounter: Secondary | ICD-10-CM | POA: Diagnosis not present

## 2022-01-04 DIAGNOSIS — I214 Non-ST elevation (NSTEMI) myocardial infarction: Secondary | ICD-10-CM | POA: Diagnosis not present

## 2022-01-04 DIAGNOSIS — F10188 Alcohol abuse with other alcohol-induced disorder: Secondary | ICD-10-CM | POA: Diagnosis not present

## 2022-01-04 DIAGNOSIS — R6510 Systemic inflammatory response syndrome (SIRS) of non-infectious origin without acute organ dysfunction: Secondary | ICD-10-CM | POA: Insufficient documentation

## 2022-01-04 DIAGNOSIS — J984 Other disorders of lung: Secondary | ICD-10-CM | POA: Diagnosis not present

## 2022-01-04 DIAGNOSIS — I21A1 Myocardial infarction type 2: Secondary | ICD-10-CM | POA: Diagnosis not present

## 2022-01-04 DIAGNOSIS — R29818 Other symptoms and signs involving the nervous system: Secondary | ICD-10-CM | POA: Diagnosis not present

## 2022-01-04 DIAGNOSIS — G40509 Epileptic seizures related to external causes, not intractable, without status epilepticus: Secondary | ICD-10-CM | POA: Diagnosis not present

## 2022-01-04 DIAGNOSIS — R000 Tachycardia, unspecified: Secondary | ICD-10-CM | POA: Diagnosis not present

## 2022-01-04 DIAGNOSIS — R092 Respiratory arrest: Secondary | ICD-10-CM | POA: Diagnosis not present

## 2022-01-04 DIAGNOSIS — D72829 Elevated white blood cell count, unspecified: Secondary | ICD-10-CM | POA: Diagnosis not present

## 2022-01-04 DIAGNOSIS — N200 Calculus of kidney: Secondary | ICD-10-CM | POA: Diagnosis not present

## 2022-01-04 DIAGNOSIS — E872 Acidosis, unspecified: Secondary | ICD-10-CM | POA: Diagnosis not present

## 2022-01-04 DIAGNOSIS — G928 Other toxic encephalopathy: Secondary | ICD-10-CM | POA: Diagnosis not present

## 2022-01-04 DIAGNOSIS — Z4682 Encounter for fitting and adjustment of non-vascular catheter: Secondary | ICD-10-CM | POA: Diagnosis not present

## 2022-01-04 DIAGNOSIS — E119 Type 2 diabetes mellitus without complications: Secondary | ICD-10-CM | POA: Insufficient documentation

## 2022-01-04 DIAGNOSIS — J90 Pleural effusion, not elsewhere classified: Secondary | ICD-10-CM | POA: Diagnosis not present

## 2022-01-04 DIAGNOSIS — R748 Abnormal levels of other serum enzymes: Secondary | ICD-10-CM | POA: Diagnosis not present

## 2022-01-04 DIAGNOSIS — K7201 Acute and subacute hepatic failure with coma: Secondary | ICD-10-CM | POA: Diagnosis not present

## 2022-01-04 DIAGNOSIS — R401 Stupor: Secondary | ICD-10-CM | POA: Diagnosis not present

## 2022-01-04 DIAGNOSIS — G319 Degenerative disease of nervous system, unspecified: Secondary | ICD-10-CM | POA: Diagnosis not present

## 2022-01-04 DIAGNOSIS — G9341 Metabolic encephalopathy: Secondary | ICD-10-CM | POA: Diagnosis not present

## 2022-01-04 DIAGNOSIS — R9082 White matter disease, unspecified: Secondary | ICD-10-CM | POA: Diagnosis not present

## 2022-01-04 DIAGNOSIS — G459 Transient cerebral ischemic attack, unspecified: Secondary | ICD-10-CM | POA: Diagnosis not present

## 2022-01-04 DIAGNOSIS — R160 Hepatomegaly, not elsewhere classified: Secondary | ICD-10-CM | POA: Diagnosis not present

## 2022-01-04 DIAGNOSIS — R4189 Other symptoms and signs involving cognitive functions and awareness: Secondary | ICD-10-CM | POA: Diagnosis not present

## 2022-01-04 DIAGNOSIS — I959 Hypotension, unspecified: Secondary | ICD-10-CM | POA: Insufficient documentation

## 2022-01-04 DIAGNOSIS — K760 Fatty (change of) liver, not elsewhere classified: Secondary | ICD-10-CM | POA: Diagnosis not present

## 2022-01-04 DIAGNOSIS — Z452 Encounter for adjustment and management of vascular access device: Secondary | ICD-10-CM | POA: Diagnosis not present

## 2022-01-04 DIAGNOSIS — Z720 Tobacco use: Secondary | ICD-10-CM | POA: Diagnosis not present

## 2022-01-04 DIAGNOSIS — J9601 Acute respiratory failure with hypoxia: Secondary | ICD-10-CM | POA: Diagnosis not present

## 2022-01-04 DIAGNOSIS — F1010 Alcohol abuse, uncomplicated: Secondary | ICD-10-CM | POA: Insufficient documentation

## 2022-01-04 DIAGNOSIS — J9811 Atelectasis: Secondary | ICD-10-CM | POA: Diagnosis not present

## 2022-01-04 DIAGNOSIS — T402X1A Poisoning by other opioids, accidental (unintentional), initial encounter: Secondary | ICD-10-CM | POA: Diagnosis not present

## 2022-01-04 DIAGNOSIS — R079 Chest pain, unspecified: Secondary | ICD-10-CM | POA: Diagnosis not present

## 2022-01-05 DIAGNOSIS — G9341 Metabolic encephalopathy: Secondary | ICD-10-CM | POA: Diagnosis not present

## 2022-01-05 DIAGNOSIS — R092 Respiratory arrest: Secondary | ICD-10-CM | POA: Diagnosis not present

## 2022-01-05 DIAGNOSIS — R4189 Other symptoms and signs involving cognitive functions and awareness: Secondary | ICD-10-CM | POA: Diagnosis not present

## 2022-01-05 DIAGNOSIS — F1010 Alcohol abuse, uncomplicated: Secondary | ICD-10-CM | POA: Diagnosis not present

## 2022-01-05 DIAGNOSIS — D72829 Elevated white blood cell count, unspecified: Secondary | ICD-10-CM | POA: Diagnosis not present

## 2022-01-05 DIAGNOSIS — J9811 Atelectasis: Secondary | ICD-10-CM | POA: Diagnosis not present

## 2022-01-05 DIAGNOSIS — N179 Acute kidney failure, unspecified: Secondary | ICD-10-CM | POA: Diagnosis not present

## 2022-01-05 DIAGNOSIS — Z4682 Encounter for fitting and adjustment of non-vascular catheter: Secondary | ICD-10-CM | POA: Diagnosis not present

## 2022-01-05 DIAGNOSIS — R569 Unspecified convulsions: Secondary | ICD-10-CM | POA: Diagnosis not present

## 2022-01-05 DIAGNOSIS — E872 Acidosis, unspecified: Secondary | ICD-10-CM | POA: Diagnosis not present

## 2022-01-06 DIAGNOSIS — R29818 Other symptoms and signs involving the nervous system: Secondary | ICD-10-CM | POA: Diagnosis not present

## 2022-01-07 DIAGNOSIS — R092 Respiratory arrest: Secondary | ICD-10-CM | POA: Diagnosis not present

## 2022-01-07 DIAGNOSIS — R4189 Other symptoms and signs involving cognitive functions and awareness: Secondary | ICD-10-CM | POA: Diagnosis not present

## 2022-01-07 DIAGNOSIS — F1010 Alcohol abuse, uncomplicated: Secondary | ICD-10-CM | POA: Diagnosis not present

## 2022-01-07 DIAGNOSIS — G9341 Metabolic encephalopathy: Secondary | ICD-10-CM | POA: Diagnosis not present

## 2022-01-07 DIAGNOSIS — R569 Unspecified convulsions: Secondary | ICD-10-CM | POA: Diagnosis not present

## 2022-01-07 DIAGNOSIS — J9811 Atelectasis: Secondary | ICD-10-CM | POA: Diagnosis not present

## 2022-01-07 DIAGNOSIS — N179 Acute kidney failure, unspecified: Secondary | ICD-10-CM | POA: Diagnosis not present

## 2022-01-07 DIAGNOSIS — D72829 Elevated white blood cell count, unspecified: Secondary | ICD-10-CM | POA: Diagnosis not present

## 2022-01-07 DIAGNOSIS — E872 Acidosis, unspecified: Secondary | ICD-10-CM | POA: Diagnosis not present

## 2022-01-08 DIAGNOSIS — J9811 Atelectasis: Secondary | ICD-10-CM | POA: Diagnosis not present

## 2022-01-08 DIAGNOSIS — R4189 Other symptoms and signs involving cognitive functions and awareness: Secondary | ICD-10-CM | POA: Diagnosis not present

## 2022-01-08 DIAGNOSIS — D72829 Elevated white blood cell count, unspecified: Secondary | ICD-10-CM | POA: Diagnosis not present

## 2022-01-08 DIAGNOSIS — N179 Acute kidney failure, unspecified: Secondary | ICD-10-CM | POA: Diagnosis not present

## 2022-01-08 DIAGNOSIS — F1010 Alcohol abuse, uncomplicated: Secondary | ICD-10-CM | POA: Diagnosis not present

## 2022-01-08 DIAGNOSIS — R569 Unspecified convulsions: Secondary | ICD-10-CM | POA: Diagnosis not present

## 2022-01-08 DIAGNOSIS — G9341 Metabolic encephalopathy: Secondary | ICD-10-CM | POA: Diagnosis not present

## 2022-01-08 DIAGNOSIS — E872 Acidosis, unspecified: Secondary | ICD-10-CM | POA: Diagnosis not present

## 2022-01-08 DIAGNOSIS — R092 Respiratory arrest: Secondary | ICD-10-CM | POA: Diagnosis not present

## 2022-01-09 DIAGNOSIS — G459 Transient cerebral ischemic attack, unspecified: Secondary | ICD-10-CM | POA: Diagnosis not present

## 2022-01-10 ENCOUNTER — Other Ambulatory Visit (HOSPITAL_COMMUNITY): Payer: Self-pay

## 2022-01-10 DIAGNOSIS — R092 Respiratory arrest: Secondary | ICD-10-CM | POA: Diagnosis not present

## 2022-01-10 MED ORDER — METFORMIN HCL 1000 MG PO TABS
ORAL_TABLET | ORAL | 0 refills | Status: DC
  Filled 2022-01-10 – 2022-02-09 (×3): qty 60, 30d supply, fill #0

## 2022-01-10 MED ORDER — LOSARTAN POTASSIUM 100 MG PO TABS
ORAL_TABLET | ORAL | 0 refills | Status: DC
  Filled 2022-01-10 – 2022-02-09 (×3): qty 30, 30d supply, fill #0

## 2022-01-10 MED ORDER — FUROSEMIDE 20 MG PO TABS
ORAL_TABLET | ORAL | 0 refills | Status: DC
Start: 2022-01-10 — End: 2022-03-27
  Filled 2022-01-10: qty 4, 4d supply, fill #0

## 2022-01-12 ENCOUNTER — Other Ambulatory Visit (HOSPITAL_COMMUNITY): Payer: Self-pay

## 2022-01-12 ENCOUNTER — Encounter: Payer: Self-pay | Admitting: Family Medicine

## 2022-01-12 ENCOUNTER — Ambulatory Visit (INDEPENDENT_AMBULATORY_CARE_PROVIDER_SITE_OTHER): Payer: 59 | Admitting: Family Medicine

## 2022-01-12 VITALS — BP 129/75 | HR 70 | Ht 72.0 in | Wt 228.0 lb

## 2022-01-12 DIAGNOSIS — I10 Essential (primary) hypertension: Secondary | ICD-10-CM

## 2022-01-12 DIAGNOSIS — E119 Type 2 diabetes mellitus without complications: Secondary | ICD-10-CM | POA: Diagnosis not present

## 2022-01-12 MED ORDER — BLOOD GLUCOSE MONITOR KIT: PACK | 0 refills | Status: DC

## 2022-01-12 MED ORDER — FREESTYLE LIBRE 3 SENSOR MISC
1.0000 | 11 refills | Status: DC
  Filled 2022-01-12: qty 2, 28d supply, fill #0

## 2022-01-12 NOTE — Patient Instructions (Addendum)
Schedule lab appointment for Friday (approximately)  Blood pressure is at goal for age and co-morbidities.  I recommend medication.  In addition they were instructed on the following: - BP goal <130/80 - monitor and log blood pressures at home - check around the same time each day in a relaxed setting - Limit salt to <2000 mg/day - Follow DASH eating plan (heart healthy diet) - limit alcohol to 2 standard drinks per day for men and 1 per day for women - avoid tobacco products - get at least 2 hours of regular aerobic exercise weekly Patient aware of signs/symptoms requiring further/urgent evaluation. Labs updated today.   DIABETES Poorly controlled with last A1c ?9% Continue current medications On ACEi  Discussed diet and exercise F/u in 3 months   Thank you for choosing [facility] at [facility] for your Primary Care needs. I am excited for the opportunity to partner with you to meet your health care goals. It was a pleasure meeting you today!   Information on diet, exercise, and health maintenance recommendations are listed below. This is information to help you be sure you are on track for optimal health and monitoring.   Please look over this and let us know if you have any questions or if you have completed any of the health maintenance outside of [place] so that we can be sure your records are up to date.  ___________________________________________________________  MyChart:  For all urgent or time sensitive needs we ask that you please call the office to avoid delays. Our number is [phone]. MyChart is not constantly monitored and due to the large volume of messages a day, replies may take up to 72 business hours.  MyChart Policy: MyChart allows for you to see your visit notes, after visit summary, provider recommendations, lab and tests results, make an appointment, request refills, and contact your provider or the office for non-urgent  questions or concerns. Providers are seeing patients during normal business hours and do not have built in time to review MyChart messages.  We ask that you allow a minimum of 3 business days for responses to [employer]. For this reason, please do not send urgent requests through [place]. Please call the office at [phone]. New and ongoing conditions may require a visit. We have virtual and in-person visits available for your convenience.  Complex MyChart concerns may require a visit. Your provider may request you schedule a virtual or in-person visit to ensure we are providing the best care possible. MyChart messages sent after 11:00 AM on Friday will not be received by the provider until Monday morning.    Lab and Test Results: You will receive your lab and test results on MyChart as soon as they are completed and results have been sent by the lab or testing facility. Due to this service, you will receive your results BEFORE your provider.  I review lab and test results each morning prior to seeing patients. Some results require collaboration with other providers to ensure you are receiving the most appropriate care. For this reason, we ask that you please allow a minimum of 3-5 business days from the time that ALL results have been received for your provider to receive and review lab and test results and contact you about these.  Most lab and test result comments from the provider will be sent through [place]. Your provider may recommend changes to the plan of care, follow-up visits, repeat testing, ask questions, or request an  office visit to discuss these results. You may reply directly to this message or call the office to provide information for the provider or set up an appointment. In some instances, you will be called with test results and recommendations. Please let us know if this is preferred and we will make note of this in your chart to provide this for you.    If you have not  heard a response to your lab or test results in 5 business days from all results returning to [place], please call the office to let us know. We ask that you please avoid calling prior to this time unless there is an emergent concern. Due to high call volumes, this can delay the resulting process.  After Hours: For all non-emergency after hours needs, please call the office at [phone] and select the option to reach the on-call  service. On-call services are shared between multiple [place] offices and therefore it will not be possible to speak directly with your provider. On-call providers may provide medical advice and recommendations, but are unable to provide refills for maintenance medications.  For all emergency or urgent medical needs after normal business hours, we recommend that you seek care at the closest Urgent Care or Emergency Department to ensure appropriate treatment in a timely manner.  [facility] [place] has a 24 hour emergency room located on the ground floor for your convenience.   Urgent Concerns During the Business Day Providers are seeing patients from 8AM to [place] with a busy schedule and are most often not able to respond to non-urgent calls until the end of the day or the next business day. If you should have URGENT concerns during the day, please call and speak to the nurse or schedule a same day appointment so that we can address your concern without delay.   Thank you, again, for choosing me as your health care partner. I appreciate your trust and look forward to learning more about you.   [facility] [place] Olevia Bowens, DNP, FNP-C  ___________________________________________________________  Health Maintenance Recommendations Screening Testing Mammogram Every 1-2 years based on history and risk factors Starting at age 46 Pap Smear Ages 21-39 every 3 years Ages 49-65 every 5 years with HPV testing More frequent testing may be required based on results and  history Colon Cancer Screening Every 1-10 years based on test performed, risk factors, and history Starting at age 68 Bone Density Screening Every 2-10 years based on history Starting at age 15 for women Recommendations for men differ based on medication usage, history, and risk factors AAA Screening One time ultrasound Men 40-89 years old who have ever smoked Lung Cancer Screening Low Dose Lung CT every 12 months Age 30-80 years with a 20 pack-year smoking history who still smoke or who have quit within the last 15 years  Screening Labs Routine  Labs: Complete Blood Count (CBC), Complete Metabolic Panel (CMP), Cholesterol (Lipid Panel) Every 6-12 months based on history and medications May be recommended more frequently based on current conditions or previous results Hemoglobin A1c Lab Every 3-12 months based on history and previous results Starting at age 96 or earlier with diagnosis of diabetes, high cholesterol, BMI >26, and/or risk factors Frequent monitoring for patients with diabetes to ensure blood sugar control Thyroid Panel (TSH w/ T3 & T4) Every 6 months based on history, symptoms, and risk factors May be repeated more often if on medication HIV One time testing for all patients 22 and older May be  repeated more frequently for patients with increased risk factors or exposure Hepatitis C One time testing for all patients 18 and older May be repeated more frequently for patients with increased risk factors or exposure Gonorrhea, Chlamydia Every 12 months for all sexually active persons 13-24 years Additional monitoring may be recommended for those who are considered high risk or who have symptoms PSA Men 73-52 years old with risk factors Additional screening may be recommended from age 87-69 based on risk factors, symptoms, and history  Vaccine Recommendations Tetanus Booster All adults every 10 years Flu Vaccine All patients 6 months and older every year COVID  Vaccine All patients 12 years and older Initial dosing with booster May recommend additional booster based on age and health history HPV Vaccine 2 doses all patients age 26-26 Dosing may be considered for patients over 26 Shingles Vaccine (Shingrix) 2 doses all adults 55 years and older Pneumonia (Pneumovax 59) All adults 5 years and older May recommend earlier dosing based on health history Pneumonia (Prevnar 66) All adults 58 years and older Dosed 1 year after Pneumovax 23 Pneumonia (Prevnar 29) All adults 24 years and older (adults 41-32 with certain conditions or risk factors) 1 dose  For those who have no received Prevnar 13 vaccine previously   Additional Screening, Testing, and Vaccinations may be recommended on an individualized basis based on family history, health history, risk factors, and/or exposure.  __________________________________________________________  Diet Recommendations for All Patients  I recommend that all patients maintain a diet low in saturated fats, carbohydrates, and cholesterol. While this can be challenging at first, it is not impossible and small changes can make big differences.  Things to try: Decreasing the amount of soda, sweet tea, and/or juice to one or less per day and replace with water While water is always the first choice, if you do not like water you may consider adding a water additive without sugar to improve the taste other sugar free drinks Replace potatoes with a brightly colored vegetable at dinner Use healthy oils, such as canola oil or olive oil, instead of butter or hard margarine Limit your bread intake to two pieces or less a day Replace regular pasta with low carb pasta options Bake, broil, or grill foods instead of frying Monitor portion sizes  Eat smaller, more frequent meals throughout the day instead of large meals  An important thing to remember is, if you love foods that are not great for your health, you don't  have to give them up completely. Instead, allow these foods to be a reward when you have done well. Allowing yourself to still have special treats every once in a while is a nice way to tell yourself thank you for working hard to keep yourself healthy.   Also remember that every day is a new day. If you have a bad day and "fall off the wagon", you can still climb right back up and keep moving along on your journey!  We have resources available to help you!  Some websites that may be helpful include: www.http://carter.biz/  Www.VeryWellFit.com _____________________________________________________________  Activity Recommendations for All Patients  I recommend that all adults get at least 20 minutes of moderate physical activity that elevates your heart rate at least 5 days out of the week.  Some examples include: Walking or jogging at a pace that allows you to carry on a conversation Cycling (stationary bike or outdoors) Water aerobics Yoga Weight lifting Dancing If physical limitations prevent you from putting stress  on your joints, exercise in a pool or seated in a chair are excellent options.  Do determine your MAXIMUM heart rate for activity: YOUR AGE - 220 = MAX HeartRate   Remember! Do not push yourself too hard.  Start slowly and build up your pace, speed, weight, time in exercise, etc.  Allow your body to rest between exercise and get good sleep. You will need more water than normal when you are exerting yourself. Do not wait until you are thirsty to drink. Drink with a purpose of getting in at least 8, 8 ounce glasses of water a day plus more depending on how much you exercise and sweat.    If you begin to develop dizziness, chest pain, abdominal pain, jaw pain, shortness of breath, headache, vision changes, lightheadedness, or other concerning symptoms, stop the activity and allow your body to rest. If your symptoms are severe, seek emergency evaluation immediately. If your symptoms  are concerning, but not severe, please let us know so that we can recommend further evaluation.

## 2022-01-12 NOTE — Progress Notes (Signed)
? ?______________________________________________________________________ ? ?HPI ?Brian Pierce is a 51 y.o. male presenting to [place] at [facility] today to establish care.  ? ?Patient Care Team: ?Terrilyn Saver, NP as PCP - General (Family Medicine) ? ?Health Maintenance  ?Topic Date Due  ? COVID-19 Vaccine (1) Never done  ? HIV Screening  Never done  ? Hepatitis C Screening: USPSTF Recommendation to screen - Ages 94-79 yo.  Never done  ? Zoster (Shingles) Vaccine (1 of 2) Never done  ? Flu Shot  06/09/2021  ? Tetanus Vaccine  03/03/2028  ? Colon Cancer Screening  09/10/2030  ? HPV Vaccine  Aged Out  ? ? ? ?Concerns today: ?Recent hospitalization (2/24: 6 days in ICU, then med-surg, d/c'd on 01/10/22)- unresponsive in car at [employer]. Says they gave him fentanyl to intubate him and then less than an hour later checked urine for drugs and it was positive for fentanyl - admits to using alcohol, but denies any illegal drugs/substances. He was given 4 days of lasix (tomorrow is last day) - Per intubation report pretreatment with etomidate and succinylcholine. ?A1c was 9.8% - currently on metformin since discharge (was not on anything previously). Several years ago he had been prescribed metformin, but chose lifestyle management and ended up not needing meds.  ?CTA head/neck - unremarkable ?Bronchoscopy for mucus plugging - tolerated well, no complications ?MRI head - toxic/metabolic vs anoxic injury, encephalopathy  ?EEG - encephalopathy , nonspecific etiology ?Abd Xray - unremarkable ?US liver - hematic steatosis, hepatomegaly, borderline renomegaly, small R renal calculus no obstruction ?CT chest/abd - tiny bilateral effusions, nonobstructive bilateral renal stones ?Echo - EF 55-60% ,NSR ? ? ?States he is feeling good overall. No complaints today. States he couldn't get into his regular PCP soon enough, so he is switching to establish care here.  ?  ? ? ? ?Patient Active Problem List  ?  Diagnosis Date Noted  ? Colon cancer screening   ? Benign neoplasm of colon   ? Difficult intubation 06/14/2020  ? ALLERGIC RHINITIS 08/14/2008  ? GERD 08/14/2008  ? LIVER FUNCTION TESTS, ABNORMAL, HX OF 08/14/2008  ? ? ?_____________________________________________________________________ ?PMH ?Past Medical History:  ?Diagnosis Date  ? Arthritis   ? Back pain   ? lower  ? Dizziness   ? Dizzy spells   ? " work injury hit with piece of steel feel dizzy and wavy feeling without pain hit with steel 2 months ago"  ? Elevated liver enzymes 06/2018  ? GERD (gastroesophageal reflux disease)   ? Headache   ? High cholesterol   ? History of kidney stones   ? Poor balance   ? Prediabetes   ? Substance abuse ([place])   ? Drinking struggles  ? Umbilical hernia   ? ? ?ROS ?All review of systems negative except what is listed in the HPI ? ?PHYSICAL EXAM ?Physical Exam ?Vitals reviewed.  ?Constitutional:   ?   Appearance: Normal appearance.  ?HENT:  ?   Head: Normocephalic and atraumatic.  ?Cardiovascular:  ?   Rate and Rhythm: Normal rate and regular rhythm.  ?Pulmonary:  ?   Effort: Pulmonary effort is normal.  ?   Breath sounds: Normal breath sounds.  ?Musculoskeletal:  ?   Right lower leg: No edema.  ?   Left lower leg: No edema.  ?Skin: ?   General: Skin is warm and dry.  ?Neurological:  ?   General: No focal deficit present.  ?   Mental Status: He  is alert and oriented to person, place, and time. Mental status is at baseline.  ?Psychiatric:     ?   Mood and Affect: Mood normal.     ?   Behavior: Behavior normal.     ?   Thought Content: Thought content normal.     ?   Judgment: Judgment normal.  ? ?______________________________________________________________________ ?ASSESSMENT AND PLAN ? ?1. Primary hypertension ?Blood pressure is at goal for age and co-morbidities.  I recommend continuing medication.  In addition they were instructed on the following: ?- BP goal <130/80 ?- monitor and log blood pressures at home ?- check  around the same time each day in a relaxed setting ?- Limit salt to <2000 mg/day ?- Follow DASH eating plan (heart healthy diet) ?- limit alcohol to 2 standard drinks per day for men and 1 per day for women ?- avoid tobacco products ?- get at least 2 hours of regular aerobic exercise weekly ?Patient aware of signs/symptoms requiring further/urgent evaluation. ?Labs ordered today - need to monitor renal fxn after finishing lasix  ?- CBC; Future ?- Comprehensive metabolic panel; Future ? ?2. Type 2 diabetes mellitus without complication, without long-term current use of insulin ([place]) ?Education on diet and exercise discussed. Patient aware that he likely needs more than metformin d/t how high his A1c is, but patient is determined to start focusing on diet and exercise (states he did it in the past). He is agreeable to at least continue the metformin and start logging morning fasting sugars.  ?- CBC; Future ?- Comprehensive metabolic panel; Future ?- blood glucose meter kit and supplies KIT; Dispense based on patient and insurance preference. Use up to four times daily as directed. Please include lancets, test strips, control solution.  Dispense: 1 each; Refill: 0 ?- Continuous Blood Gluc Sensor (FREESTYLE LIBRE 3 SENSOR) MISC; 1 each by Does not apply route every 14 (fourteen) days. Place 1 sensor on the skin every 14 days. Use to check glucose continuously  Dispense: 2 each; Refill: 11 ? ?Establish care ?Education provided today during visit and on AVS for patient to review at home.  ?Diet and Exercise recommendations provided.  ?Current diagnoses and recommendations discussed. ?HM recommendations reviewed with recommendations.  ? ? ?Outpatient Encounter Medications as of 01/12/2022  ?Medication Sig  ? blood glucose meter kit and supplies KIT Dispense based on patient and insurance preference. Use up to four times daily as directed. Please include lancets, test strips, control solution.  ? Continuous Blood Gluc Sensor  (FREESTYLE LIBRE 3 SENSOR) MISC 1 each by Does not apply route every 14 (fourteen) days. Place 1 sensor on the skin every 14 days. Use to check glucose continuously  ? furosemide (LASIX) 20 MG tablet Take one tablet (20 mg dose) by mouth daily for 4 days.  ? losartan (COZAAR) 100 MG tablet Take one tablet (100 mg dose) by mouth daily.  ? metFORMIN (GLUCOPHAGE) 1000 MG tablet Take one tablet (1,000 mg dose) by mouth 2 (two) times daily with meals.  ? [DISCONTINUED] azithromycin (ZITHROMAX) 250 MG tablet Take 2 tablets on day 1, then 1 tablet daily for 4 additional days  ? [DISCONTINUED] Cetirizine HCl (ZYRTEC ALLERGY) 10 MG CAPS Take 1 capsule (10 mg total) by mouth daily.  ? [DISCONTINUED] fluticasone (FLONASE) 50 MCG/ACT nasal spray Place 1 spray into both nostrils daily.  ? [DISCONTINUED] ibuprofen (ADVIL) 200 MG tablet Take 400 mg by mouth every 8 (eight) hours as needed (for pain.).  ? [DISCONTINUED] omeprazole (PRILOSEC)  40 MG capsule Take 40 mg by mouth daily before breakfast.  ? ?No facility-administered encounter medications on file as of 01/12/2022.  ? ? ?Return in about 3 months (around 04/14/2022) for dm/htn f/u. ? ? ?I spent 45 minutes dedicated to the care of this patient on the date of this encounter to include pre-visit chart review of prior notes and results, face-to-face time with the patient, and post-visit ordering of testing as indicated.  ? ? ?Purcell Nails Olevia Bowens, DNP, FNP-C ? ? ?

## 2022-01-13 ENCOUNTER — Other Ambulatory Visit: Payer: Self-pay | Admitting: Family Medicine

## 2022-01-13 ENCOUNTER — Telehealth: Payer: Self-pay | Admitting: Family Medicine

## 2022-01-13 ENCOUNTER — Other Ambulatory Visit (HOSPITAL_COMMUNITY): Payer: Self-pay

## 2022-01-13 DIAGNOSIS — E119 Type 2 diabetes mellitus without complications: Secondary | ICD-10-CM

## 2022-01-13 NOTE — Telephone Encounter (Signed)
Rx faxed

## 2022-01-13 NOTE — Telephone Encounter (Signed)
Patient states [facility] has not yet received the blood glucose meter kit and supplies KIT. He would like for it to be resent to them. Please advise.  ?

## 2022-01-16 ENCOUNTER — Other Ambulatory Visit (INDEPENDENT_AMBULATORY_CARE_PROVIDER_SITE_OTHER): Payer: 59

## 2022-01-16 DIAGNOSIS — E119 Type 2 diabetes mellitus without complications: Secondary | ICD-10-CM | POA: Diagnosis not present

## 2022-01-16 DIAGNOSIS — I10 Essential (primary) hypertension: Secondary | ICD-10-CM | POA: Diagnosis not present

## 2022-01-16 LAB — COMPREHENSIVE METABOLIC PANEL
ALT: 39 U/L (ref 0–53)
AST: 20 U/L (ref 0–37)
Albumin: 4.4 g/dL (ref 3.5–5.2)
Alkaline Phosphatase: 67 U/L (ref 39–117)
BUN: 33 mg/dL — ABNORMAL HIGH (ref 6–23)
CO2: 26 mEq/L (ref 19–32)
Calcium: 9.6 mg/dL (ref 8.4–10.5)
Chloride: 102 mEq/L (ref 96–112)
Creatinine, Ser: 1.4 mg/dL (ref 0.40–1.50)
GFR: 57.83 mL/min — ABNORMAL LOW (ref >60.00)
Glucose, Bld: 110 mg/dL — ABNORMAL HIGH (ref 70–99)
Potassium: 4.4 mEq/L (ref 3.5–5.1)
Sodium: 137 mEq/L (ref 135–145)
Total Bilirubin: 0.8 mg/dL (ref 0.2–1.2)
Total Protein: 6.9 g/dL (ref 6.0–8.3)

## 2022-01-16 LAB — CBC
HCT: 44.9 % (ref 39.0–52.0)
Hemoglobin: 15.4 g/dL (ref 13.0–17.0)
MCHC: 34.2 g/dL (ref 30.0–36.0)
MCV: 90.3 fl (ref 78.0–100.0)
Platelets: 290 10*3/uL (ref 150.0–400.0)
RBC: 4.97 Mil/uL (ref 4.22–5.81)
RDW: 13.1 % (ref 11.5–15.5)
WBC: 10.4 10*3/uL (ref 4.0–10.5)

## 2022-01-16 NOTE — Addendum Note (Signed)
Addended by: Caleen Jobs B on: 01/16/2022 06:15 PM ? ? Modules accepted: Orders ? ?

## 2022-01-16 NOTE — Progress Notes (Signed)
Labs look good overall. There was an elevation in your BUN, but other kidney function markers are stable. I would like to repeat this in about 2-4 weeks to ensure returning to normal. Please call and schedule lab appointment.

## 2022-01-19 ENCOUNTER — Other Ambulatory Visit: Payer: Self-pay

## 2022-01-19 ENCOUNTER — Encounter: Payer: Self-pay | Admitting: Family Medicine

## 2022-01-19 DIAGNOSIS — E119 Type 2 diabetes mellitus without complications: Secondary | ICD-10-CM

## 2022-01-28 ENCOUNTER — Other Ambulatory Visit (HOSPITAL_COMMUNITY): Payer: Self-pay

## 2022-01-28 ENCOUNTER — Other Ambulatory Visit: Payer: Self-pay | Admitting: Family Medicine

## 2022-01-28 ENCOUNTER — Encounter: Payer: Self-pay | Admitting: Family Medicine

## 2022-01-28 ENCOUNTER — Other Ambulatory Visit: Payer: Self-pay

## 2022-01-28 DIAGNOSIS — E119 Type 2 diabetes mellitus without complications: Secondary | ICD-10-CM

## 2022-01-28 MED ORDER — BLOOD GLUCOSE MONITOR SYSTEM W/DEVICE KIT
PACK | 0 refills | Status: DC
  Filled 2022-01-28: qty 1, 1d supply, fill #0

## 2022-01-28 MED ORDER — FREESTYLE LANCETS MISC
0 refills | Status: DC
  Filled 2022-01-28: qty 100, 90d supply, fill #0

## 2022-01-28 MED ORDER — FREESTYLE LITE TEST VI STRP
ORAL_STRIP | 0 refills | Status: DC
Start: 2022-01-28 — End: 2022-08-31
  Filled 2022-01-28: qty 50, 50d supply, fill #0
  Filled 2022-05-04: qty 50, 50d supply, fill #1
  Filled 2022-05-18: qty 50, 50d supply, fill #0

## 2022-01-29 ENCOUNTER — Ambulatory Visit: Payer: 59 | Admitting: Family Medicine

## 2022-02-02 ENCOUNTER — Encounter: Payer: Self-pay | Admitting: Family Medicine

## 2022-02-02 ENCOUNTER — Other Ambulatory Visit (HOSPITAL_COMMUNITY): Payer: Self-pay

## 2022-02-03 ENCOUNTER — Other Ambulatory Visit (INDEPENDENT_AMBULATORY_CARE_PROVIDER_SITE_OTHER): Payer: 59

## 2022-02-03 DIAGNOSIS — E119 Type 2 diabetes mellitus without complications: Secondary | ICD-10-CM

## 2022-02-03 DIAGNOSIS — I10 Essential (primary) hypertension: Secondary | ICD-10-CM

## 2022-02-03 LAB — BASIC METABOLIC PANEL
BUN: 28 mg/dL — ABNORMAL HIGH (ref 6–23)
CO2: 23 mEq/L (ref 19–32)
Calcium: 9.3 mg/dL (ref 8.4–10.5)
Chloride: 105 mEq/L (ref 96–112)
Creatinine, Ser: 1.11 mg/dL (ref 0.40–1.50)
GFR: 76.38 mL/min (ref >60.00)
Glucose, Bld: 145 mg/dL — ABNORMAL HIGH (ref 70–99)
Potassium: 4.2 mEq/L (ref 3.5–5.1)
Sodium: 136 mEq/L (ref 135–145)

## 2022-02-03 LAB — HEMOGLOBIN A1C: Hgb A1c MFr Bld: 8.5 % — ABNORMAL HIGH (ref 4.6–6.5)

## 2022-02-04 ENCOUNTER — Encounter: Payer: Self-pay | Admitting: Family Medicine

## 2022-02-04 ENCOUNTER — Ambulatory Visit (HOSPITAL_BASED_OUTPATIENT_CLINIC_OR_DEPARTMENT_OTHER)
Admission: RE | Admit: 2022-02-04 | Discharge: 2022-02-04 | Disposition: A | Discharge status: Home / Self Care | Payer: 59 | Source: Ambulatory Visit | Attending: Family Medicine | Admitting: Family Medicine

## 2022-02-04 ENCOUNTER — Other Ambulatory Visit: Payer: Self-pay

## 2022-02-04 ENCOUNTER — Ambulatory Visit: Payer: 59 | Admitting: Family Medicine

## 2022-02-04 VITALS — BP 107/68 | HR 69 | Ht 72.0 in | Wt 220.2 lb

## 2022-02-04 DIAGNOSIS — R1012 Left upper quadrant pain: Secondary | ICD-10-CM

## 2022-02-04 DIAGNOSIS — K7689 Other specified diseases of liver: Secondary | ICD-10-CM | POA: Diagnosis not present

## 2022-02-04 DIAGNOSIS — R932 Abnormal findings on diagnostic imaging of liver and biliary tract: Secondary | ICD-10-CM

## 2022-02-04 DIAGNOSIS — N200 Calculus of kidney: Secondary | ICD-10-CM | POA: Diagnosis not present

## 2022-02-04 DIAGNOSIS — E119 Type 2 diabetes mellitus without complications: Secondary | ICD-10-CM | POA: Diagnosis not present

## 2022-02-04 DIAGNOSIS — R160 Hepatomegaly, not elsewhere classified: Secondary | ICD-10-CM | POA: Diagnosis not present

## 2022-02-04 LAB — CBC WITH DIFFERENTIAL/PLATELET
Basophils Absolute: 0 10*3/uL (ref 0.0–0.1)
Basophils Relative: 0.5 % (ref 0.0–3.0)
Eosinophils Absolute: 0.2 10*3/uL (ref 0.0–0.7)
Eosinophils Relative: 3.4 % (ref 0.0–5.0)
HCT: 41.3 % (ref 39.0–52.0)
Hemoglobin: 14.1 g/dL (ref 13.0–17.0)
Lymphocytes Relative: 24.1 % (ref 12.0–46.0)
Lymphs Abs: 1.6 10*3/uL (ref 0.7–4.0)
MCHC: 34.1 g/dL (ref 30.0–36.0)
MCV: 88.7 fl (ref 78.0–100.0)
Monocytes Absolute: 0.4 10*3/uL (ref 0.1–1.0)
Monocytes Relative: 6.5 % (ref 3.0–12.0)
Neutro Abs: 4.3 10*3/uL (ref 1.4–7.7)
Neutrophils Relative %: 65.5 % (ref 43.0–77.0)
Platelets: 139 10*3/uL — ABNORMAL LOW (ref 150.0–400.0)
RBC: 4.66 Mil/uL (ref 4.22–5.81)
RDW: 12.9 % (ref 11.5–15.5)
WBC: 6.6 10*3/uL (ref 4.0–10.5)

## 2022-02-04 IMAGING — US US ABDOMEN COMPLETE
1 series · 13 of 25 positions shown · non-contrast
Comparison: None.

CLINICAL DATA: Left upper quadrant discomfort.

EXAM:
ABDOMEN ULTRASOUND COMPLETE

[Series 2: us abdomen complete · 13 of 84 slices shown]
[im 1/84]
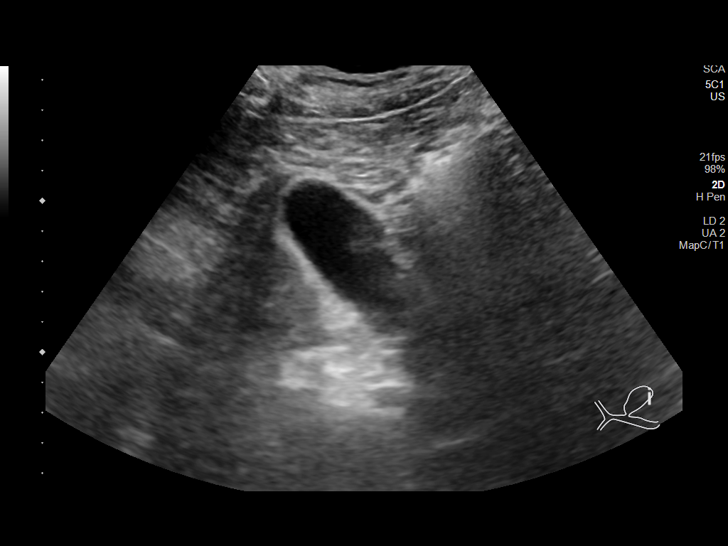
[im 7/84]
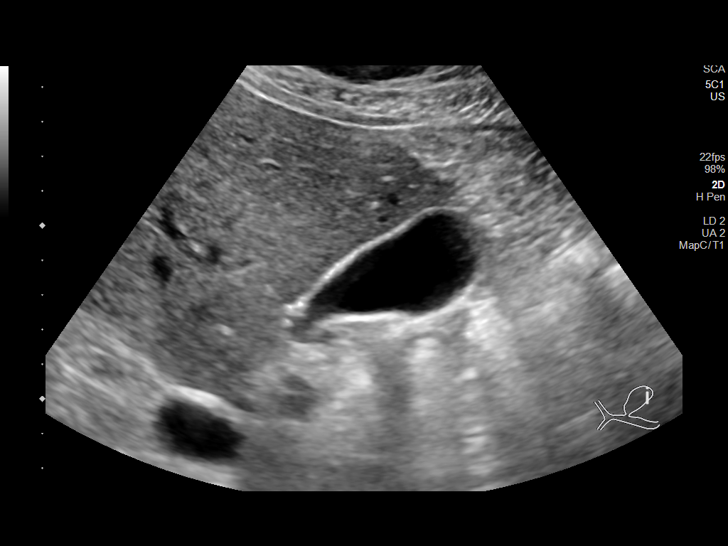
[im 14/84]
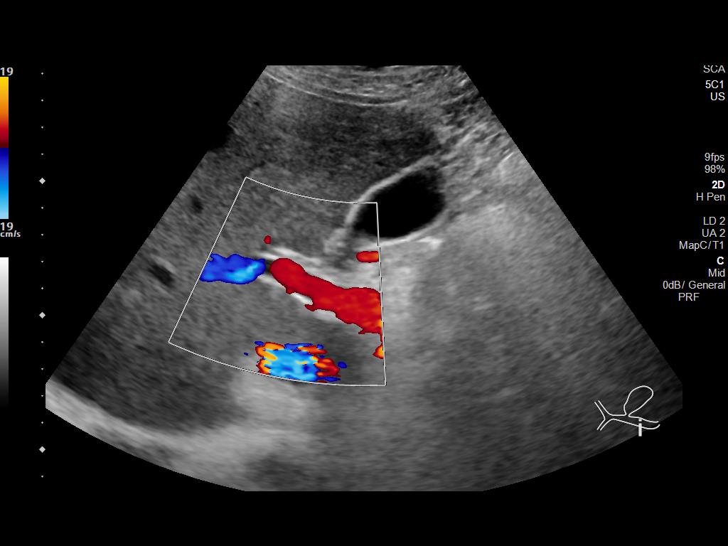
[im 21/84]
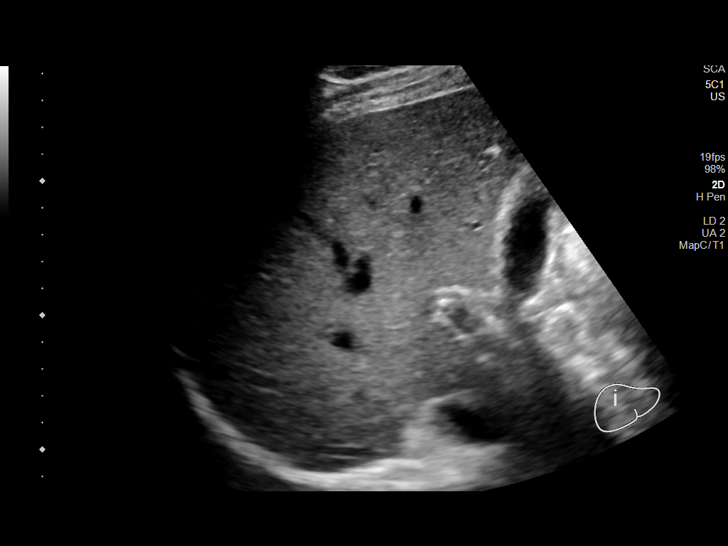
[im 28/84]
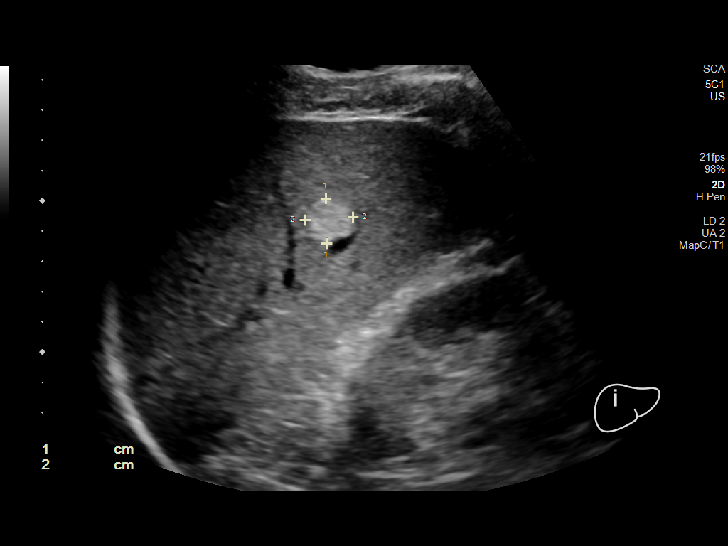
[im 35/84]
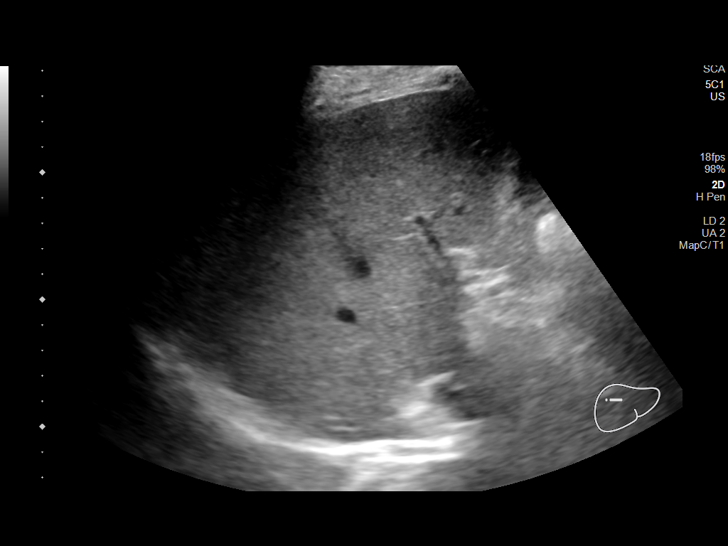
[im 42/84]
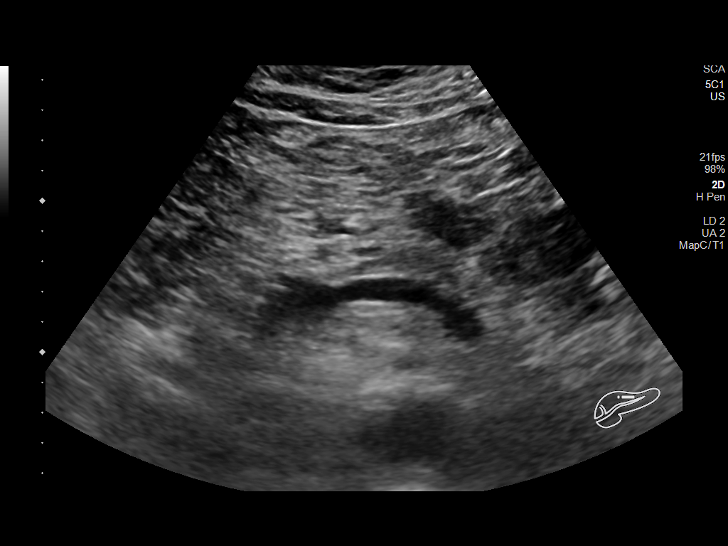
[im 49/84]
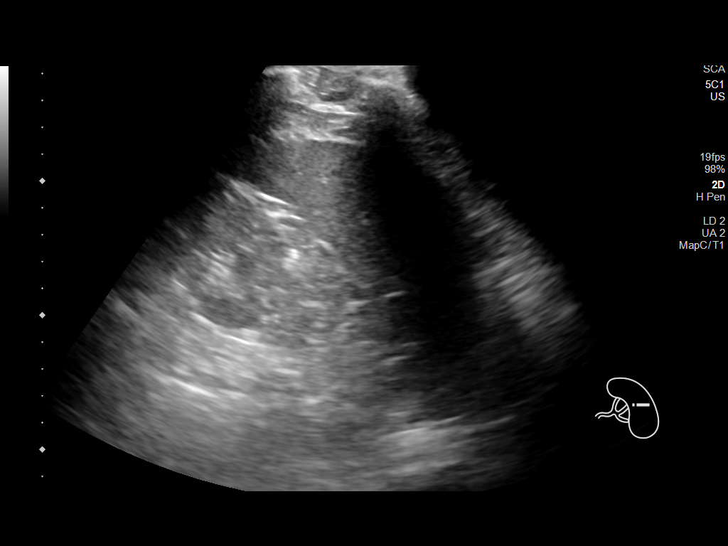
[im 56/84]
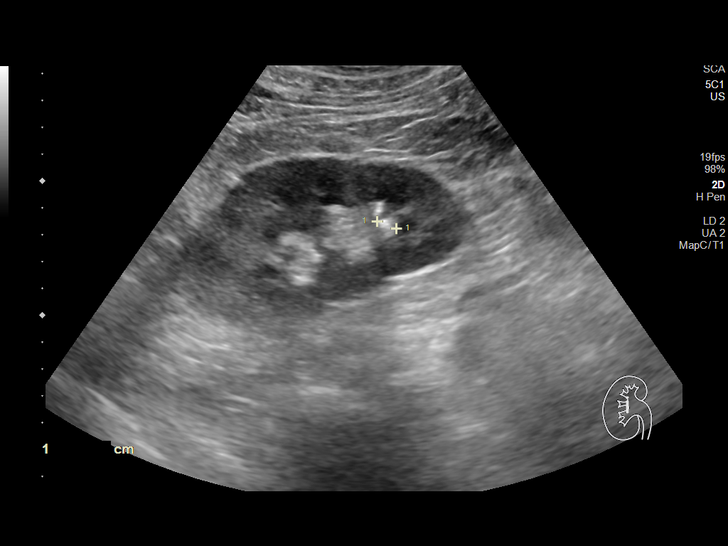
[im 63/84]
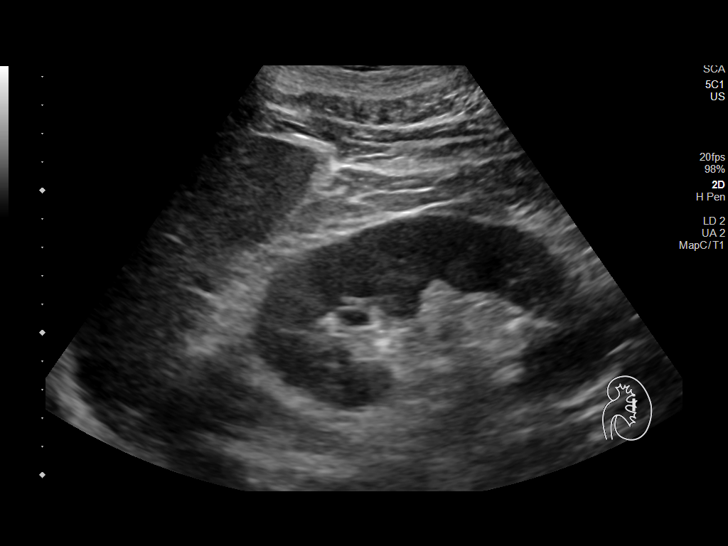
[im 70/84]
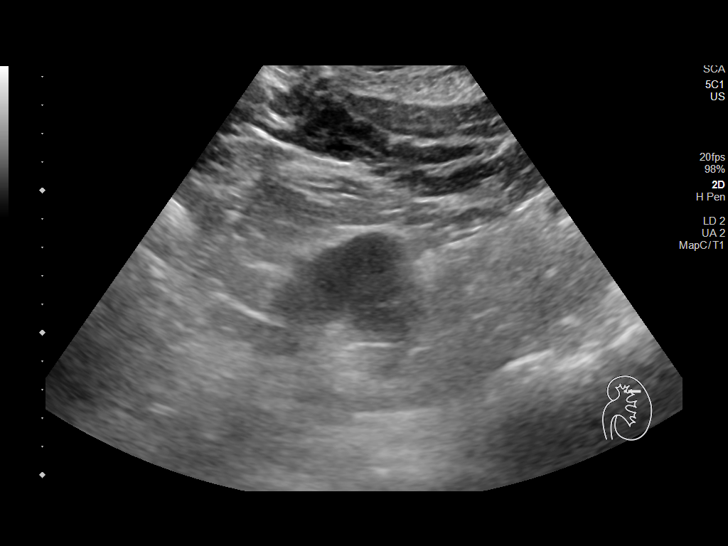
[im 77/84]
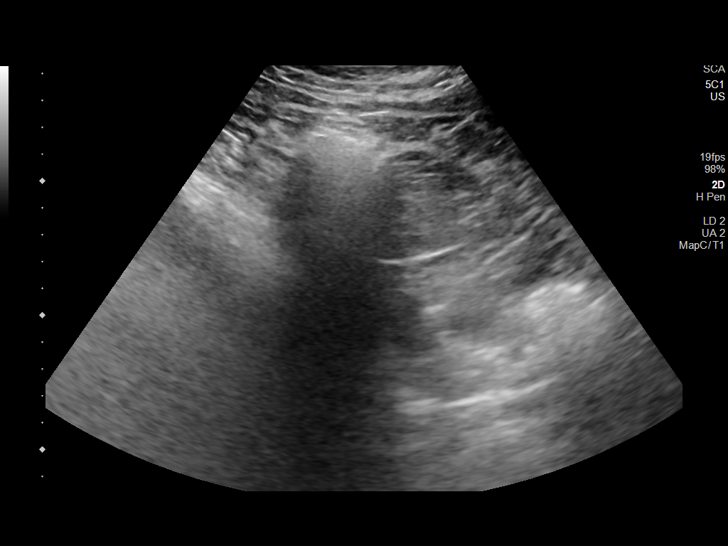
[im 84/84]
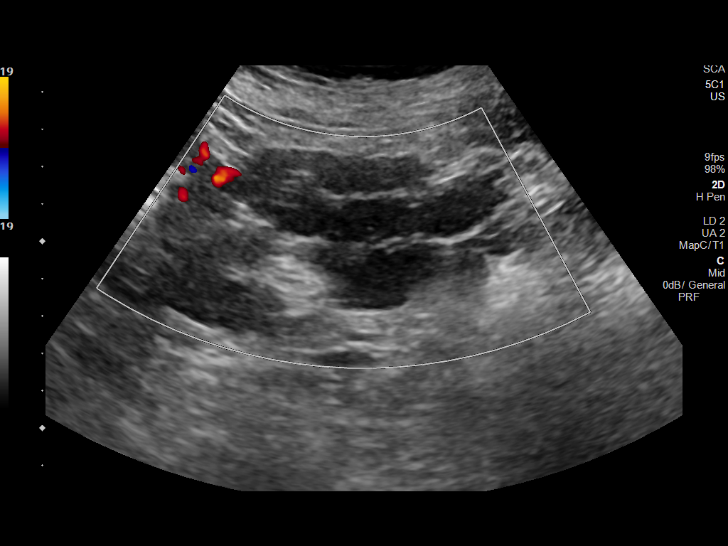

[13 of 25 positions shown; findings below may reference images not displayed]

FINDINGS: Gallbladder: No gallstones or wall thickening visualized. No
sonographic Murphy sign noted by sonographer.

Common bile duct: Diameter: 1.9 mm

Liver: There is a hyperechoic nonshadowing mass in the right hepatic
lobe measuring 1.5 x 1.6 x 1.6 cm. A second mass in the right
hepatic lobe measures 1.3 by 1.7 x 2.0 cm. Portal vein is patent on
color Doppler imaging with normal direction of blood flow towards
the liver.

IVC: No abnormality visualized.

Pancreas: Visualized portion unremarkable.

Spleen: Size and appearance within normal limits.

Right Kidney: Length: 13.1 cm. Contains a 6 mm shadowing
nonobstructive stone.

Left Kidney: Length: 12.3 cm. Echogenicity within normal limits. No
mass or hydronephrosis visualized.

Abdominal aorta: No aneurysm visualized.

Other findings: None.
IMPRESSION: 1. Two hyperechoic masses are identified in the right hepatic lobe
measuring up to 16 in 20 mm respectively. These 2 hyperechoic masses
most likely represent hemangiomas.
2. 6 mm nonobstructive stone in the right kidney.
3. No other abnormalities identified.

## 2022-02-04 NOTE — Progress Notes (Signed)
? ?Acute Office Visit ? ?Subjective:  ? ? Patient ID: Brian Pierce, male    DOB: 05-17-1971, 51 y.o.   MRN: [id number] ? ?CC: abdominal discomfort and swelling  ? ? ?HPI ?Patient is in today for abdominal discomfort and swelling.  ? ?Patient reports that about 5 days ago he noticed some left upper quadrant abdominal discomfort with swelling.  States swelling has gradually been going down over the past several days but it is still noticeable to him especially when he stands up.  Says it feels slightly uncomfortable but he is not in any pain.  Additionally he reports when he touches the left side of his abdomen he feels like the skin is " thick/numb" which he compares to touching your cheek after having a dental procedure done with numbing.  He denies any recent injuries although he does a lot of heavy lifting with his job.  Reports he has full range of motion without any pain.  He denies any urinary changes, constipation, diarrhea, blood in stool or urine, abdominal pain, fevers, rashes, bruises, nausea, vomiting, dyspnea. ? ? ?Additionally we discussed his repeat A1c which she had requested to be done.  It was down to 8.5% which is over one-point decreased since hospitalization last month.  He has been taking metformin for the past 4 to 6 weeks (1000 mg twice daily).  Reports he would like to continue focusing on lifestyle modifications before adding any other medications.  He is scheduled to follow-up in June. ? ? ? ? ?Past Medical History:  ?Diagnosis Date  ? Arthritis   ? Back pain   ? lower  ? Dizziness   ? Dizzy spells   ? " work injury hit with piece of steel feel dizzy and wavy feeling without pain hit with steel 2 months ago"  ? Elevated liver enzymes 06/2018  ? GERD (gastroesophageal reflux disease)   ? Headache   ? High cholesterol   ? History of kidney stones   ? Poor balance   ? Prediabetes   ? Substance abuse ([place])   ? Drinking struggles  ? Umbilical hernia   ? ? ?Past Surgical History:  ?Procedure  Laterality Date  ? COLONOSCOPY    ? over 10 yrs  ? COLONOSCOPY WITH PROPOFOL N/A 09/10/2020  ? Procedure: COLONOSCOPY WITH PROPOFOL;  Surgeon: Yetta Flock, MD;  Location: [facility];  Service: Gastroenterology;  Laterality: N/A;  ? colonscopy and endoscopy  12 yrs ago  ? HERNIA REPAIR Bilateral age 2  ? inguinal   ? POLYPECTOMY  09/10/2020  ? Procedure: POLYPECTOMY;  Surgeon: Yetta Flock, MD;  Location: [facility];  Service: Gastroenterology;;  ? UMBILICAL HERNIA REPAIR N/A 01/10/2019  ? Procedure: LAPAROSCOPIC ASSISTED REPAIR OF UMBILICAL HERNIA WITH MESH ERAS PATHWAY;  Surgeon: Greer Pickerel, MD;  Location: [facility];  Service: General;  Laterality: N/A;  ? UPPER GASTROINTESTINAL ENDOSCOPY    ? over 10 yrs  ? wisdom teeth extraction    ? ? ?Family History  ?Problem Relation Age of Onset  ? Diabetes Father   ? Colon cancer Neg Hx   ? Colon polyps Neg Hx   ? Esophageal cancer Neg Hx   ? Rectal cancer Neg Hx   ? Prostate cancer Neg Hx   ? ? ?Social History  ? ?Socioeconomic History  ? Marital status: Married  ?  Spouse name: Barnetta Chapel  ? Number of children: 3  ? Years of education: Not on file  ? Highest education  level: High school graduate  ?Occupational History  ?  Comment: landscaping  ?Tobacco Use  ? Smoking status: Every Day  ?  Packs/day: 0.50  ?  Types: Cigars, Cigarettes  ? Smokeless tobacco: Never  ? Tobacco comments:  ?  light smoker 3 per day  ?Vaping Use  ? Vaping Use: Never used  ?Substance and Sexual Activity  ? Alcohol use: Yes  ?  Comment: 4-5 drinks per day,   ? Drug use: Yes  ?  Types: Marijuana  ?  Comment: marijuana last used Sep 08, 2018  ? Sexual activity: Not on file  ?Other Topics Concern  ? Not on file  ?Social History Narrative  ? Lives with spouse  ? caffeine- 2-3 cups daily  ? ?Social Determinants of Health  ? ?Financial Resource Strain: Not on file  ?Food Insecurity: Not on file  ?Transportation Needs: Not on file  ?Physical Activity: Not on file  ?Stress: Not on file   ?Social Connections: Not on file  ?Intimate Partner Violence: Not on file  ? ? ?Outpatient Medications Prior to Visit  ?Medication Sig Dispense Refill  ? blood glucose meter kit and supplies KIT Dispense based on patient and insurance preference. Use up to four times daily as directed. Please include lancets, test strips, control solution. 1 each 0  ? Blood Glucose Monitoring Suppl (BLOOD GLUCOSE MONITOR SYSTEM) w/Device KIT Check blood sugar once daily as directed 1 kit 0  ? Continuous Blood Gluc Sensor (FREESTYLE LIBRE 3 SENSOR) MISC Place 1 sensor on the skin every 14 days. Use to check glucose continuously 2 each 11  ? furosemide (LASIX) 20 MG tablet Take one tablet (20 mg dose) by mouth daily for 4 days. 4 tablet 0  ? glucose blood (FREESTYLE LITE) test strip Use daily as directed 100 each 0  ? Lancets (FREESTYLE) lancets Use daily as directed 100 each 0  ? losartan (COZAAR) 100 MG tablet Take one tablet (100 mg dose) by mouth daily. 30 tablet 0  ? metFORMIN (GLUCOPHAGE) 1000 MG tablet Take one tablet (1,000 mg dose) by mouth 2 (two) times daily with meals. 60 tablet 0  ? ?No facility-administered medications prior to visit.  ? ? ?Allergies  ?Allergen Reactions  ? Codone [Hydrocodone] Itching  ? ? ?Review of Systems ?All review of systems negative except what is listed in the HPI ? ?   ?Objective:  ?  ?Physical Exam ?Vitals reviewed.  ?Constitutional:   ?   General: He is not in acute distress. ?   Appearance: Normal appearance. He is normal weight. He is not ill-appearing.  ?Abdominal:  ?   General: Abdomen is flat. Bowel sounds are normal. There is no distension.  ?   Palpations: Abdomen is soft. There is no shifting dullness or mass.  ?   Tenderness: There is abdominal tenderness in the left upper quadrant. There is no guarding or rebound.  ?   Comments: Slight asymmetry with LUQ swelling noticeable when standing only   ?Skin: ?   General: Skin is warm and dry.  ?   Findings: No bruising, erythema or rash.   ?Neurological:  ?   General: No focal deficit present.  ?   Mental Status: He is alert and oriented to person, place, and time. Mental status is at baseline.  ?Psychiatric:     ?   Mood and Affect: Mood normal.     ?   Behavior: Behavior normal.     ?   Thought  Content: Thought content normal.     ?   Judgment: Judgment normal.  ? ? ?There were no vitals taken for this visit. ?Wt Readings from Last 3 Encounters:  ?01/12/22 228 lb (103.4 kg)  ?09/10/20 250 lb (113.4 kg)  ?09/02/20 253 lb 3.2 oz (114.9 kg)  ? ? ?Health Maintenance Due  ?Topic Date Due  ? COVID-19 Vaccine (1) Never done  ? HIV Screening  Never done  ? Hepatitis C Screening  Never done  ? Zoster Vaccines- Shingrix (1 of 2) Never done  ? INFLUENZA VACCINE  06/09/2021  ? ? ?There are no preventive care reminders to display for this patient. ? ? ?No results found for: TSH ?Lab Results  ?Component Value Date  ? WBC 10.4 01/16/2022  ? HGB 15.4 01/16/2022  ? HCT 44.9 01/16/2022  ? MCV 90.3 01/16/2022  ? PLT 290.0 01/16/2022  ? ?Lab Results  ?Component Value Date  ? NA 136 02/03/2022  ? K 4.2 02/03/2022  ? CO2 23 02/03/2022  ? GLUCOSE 145 (H) 02/03/2022  ? BUN 28 (H) 02/03/2022  ? CREATININE 1.11 02/03/2022  ? BILITOT 0.8 01/16/2022  ? ALKPHOS 67 01/16/2022  ? AST 20 01/16/2022  ? ALT 39 01/16/2022  ? PROT 6.9 01/16/2022  ? ALBUMIN 4.4 01/16/2022  ? CALCIUM 9.3 02/03/2022  ? ANIONGAP 10 11/01/2019  ? GFR 76.38 02/03/2022  ? ?No results found for: CHOL ?No results found for: HDL ?No results found for: [place] ?No results found for: TRIG ?No results found for: CHOLHDL ?Lab Results  ?Component Value Date  ? HGBA1C 8.5 (H) 02/03/2022  ? ? ?   ?Assessment & Plan:  ? ?1. LUQ discomfort ?Metabolic panel yesterday was stable. Adding CBC today and getting ultrasound. Will update patient with results and plan. Patient aware of signs/symptoms requiring further/urgent evaluation.  ?- US Abdomen Complete; Future ?- CBC with Differential/Platelet ? ?2. Type 2 diabetes  mellitus without complication, without long-term current use of insulin ([place]) ?A1c is starting to drop. Continue metformin 1000 mg BID. For now, he would like to focus on lifestyle modifications before adding

## 2022-02-04 NOTE — Patient Instructions (Signed)
Metabolic panel yesterday was stable.  ?Let's check blood count and get an ultrasound to rule out potential causes. We will update you when results have been reviewed.  ?

## 2022-02-04 NOTE — Progress Notes (Signed)
Swelling LUQ 4-5 days ?Tingling/numb sensation to the left of belly buttonl  ?

## 2022-02-06 NOTE — Addendum Note (Signed)
Addended by: Caleen Jobs B on: 02/06/2022 12:04 PM ? ? Modules accepted: Orders ? ?

## 2022-02-09 ENCOUNTER — Other Ambulatory Visit (HOSPITAL_COMMUNITY): Payer: Self-pay

## 2022-02-11 ENCOUNTER — Other Ambulatory Visit (HOSPITAL_COMMUNITY): Payer: Self-pay

## 2022-02-25 ENCOUNTER — Telehealth: Payer: Self-pay | Admitting: *Deleted

## 2022-02-25 NOTE — Chronic Care Management (AMB) (Signed)
?  Care Management  ? ?Note ? ?02/25/2022 ?Name: Brian Pierce MRN: [id number] DOB: 1971/04/17 ? ?Brian Pierce is a 51 y.o. year old male who is a primary care patient of Terrilyn Saver, NP. I reached out to Concha Pyo by phone today offer care coordination services.  ? ?Brian Pierce was given information about care management services today including:  ?Care management services include personalized support from designated clinical staff supervised by his physician, including individualized plan of care and coordination with other care providers ?24/7 contact phone numbers for assistance for urgent and routine care needs. ?The patient may stop care management services at any time by phone call to the office staff. ? ?Patient agreed to services and verbal consent obtained.  ? ?Follow up plan: ?Telephone appointment with care management team member scheduled for: 02/27/2022 ? ?Brian Pierce, CCMA ?Care Guide, Embedded Care Coordination ?[place]  Care Management  ?Direct Dial: [phone] ? ? ?

## 2022-02-25 NOTE — Chronic Care Management (AMB) (Deleted)
?  Care Management  ? ?Outreach Note ? ?02/25/2022 ?Name: Brian Pierce MRN: [id number] DOB: 1971/03/07 ? ?Referred by: Terrilyn Saver, NP ?Reason for referral : Care Coordination (Initial outreach to schedule initial with RNCM ) ? ? ?{CCM OUTREACH ATTEMPTS:22247} ? ?Follow Up Plan:  ?{CCM FOLLOW UP PLAN:22241} ? ?SIGNATURE ?

## 2022-02-27 ENCOUNTER — Telehealth: Payer: 59

## 2022-02-27 ENCOUNTER — Telehealth: Payer: Self-pay

## 2022-02-27 NOTE — Telephone Encounter (Signed)
?  Care Management  ? ?Follow Up Note ? ? ?02/27/2022 ?Name: Brian Pierce MRN: [id number] DOB: Apr 11, 1971 ? ? ?Referred by: Terrilyn Saver, NP ?Reason for referral : No chief complaint on file. ? ? ?An unsuccessful telephone outreach was attempted today. The patient was referred to the case management team for assistance with care management and care coordination.  No answer. Unable to leave message. ? ?Follow Up Plan: The care management team will reach out to the patient again over the next 30 days.  ? ?Thea Silversmith, RN, MSN, BSN, CCM ?Care Management Coordinator ?[facility] ?[phone]  ?

## 2022-03-06 ENCOUNTER — Ambulatory Visit: Payer: 59

## 2022-03-06 NOTE — Chronic Care Management (AMB) (Signed)
?  Care Management  ? ?Outreach Note ? ?03/06/2022 ?Name: Brian Pierce MRN: [id number] DOB: Mar 26, 1971 ? ?Referred by: Terrilyn Saver, NP ?Reason for referral : Chronic Care Management (RNCM Initial Assessment/) ? ? ?Successful contact was made with the patient to discuss care management and care coordination services. Patient declines engagement at this time.  Mr. Nagy reports he is "feeling really good". He reports recent diagnosis of diabetes. He states he has changed his diet (no [place] bread, no pasta, no potato) and is checking blood sugars-they are ranging 90's. He reports he is punctual with medications. He reports he has quite smoking. He is walking daily as well as swimming. He denies any concern or problems. Declines engagement. ? ?Follow Up Plan:  No further follow up required ? ?Thea Silversmith, RN, MSN, BSN, CCM ?Care Management Coordinator ?[facility] ?[phone]  ?

## 2022-03-06 NOTE — Patient Instructions (Signed)
Visit Information  Thank you for allowing me to share the care management and care coordination services that are available to you as part of your health plan and services through your primary care provider and medical home. Please reach out to me at [phone] if the care management/care coordination team may be of assistance to you in the future.   Berlinda Farve, RN, MSN, BSN, CCM Care Management Coordinator [facility] [phone]  

## 2022-03-09 ENCOUNTER — Other Ambulatory Visit (HOSPITAL_COMMUNITY): Payer: Self-pay

## 2022-03-09 ENCOUNTER — Encounter: Payer: Self-pay | Admitting: Family Medicine

## 2022-03-09 MED ORDER — OMEPRAZOLE 40 MG PO CPDR
40.0000 mg | DELAYED_RELEASE_CAPSULE | Freq: Every day | ORAL | 1 refills | Status: DC
  Filled 2022-03-09: qty 90, 90d supply, fill #0
  Filled 2022-11-06: qty 90, 90d supply, fill #1

## 2022-03-10 ENCOUNTER — Other Ambulatory Visit: Payer: Self-pay | Admitting: Family Medicine

## 2022-03-10 ENCOUNTER — Other Ambulatory Visit (HOSPITAL_COMMUNITY): Payer: Self-pay

## 2022-03-11 ENCOUNTER — Other Ambulatory Visit (HOSPITAL_COMMUNITY): Payer: Self-pay

## 2022-03-11 MED ORDER — METFORMIN HCL 1000 MG PO TABS
ORAL_TABLET | ORAL | 0 refills | Status: DC
  Filled 2022-03-11: qty 60, 30d supply, fill #0

## 2022-03-11 MED ORDER — LOSARTAN POTASSIUM 100 MG PO TABS
ORAL_TABLET | ORAL | 0 refills | Status: DC
  Filled 2022-03-11: qty 30, 30d supply, fill #0

## 2022-03-13 ENCOUNTER — Other Ambulatory Visit (HOSPITAL_COMMUNITY): Payer: Self-pay

## 2022-03-27 ENCOUNTER — Other Ambulatory Visit (INDEPENDENT_AMBULATORY_CARE_PROVIDER_SITE_OTHER): Payer: 59

## 2022-03-27 ENCOUNTER — Ambulatory Visit (INDEPENDENT_AMBULATORY_CARE_PROVIDER_SITE_OTHER): Payer: 59 | Admitting: Gastroenterology

## 2022-03-27 ENCOUNTER — Encounter: Payer: Self-pay | Admitting: Gastroenterology

## 2022-03-27 VITALS — BP 116/76 | HR 72 | Ht 72.0 in | Wt 221.6 lb

## 2022-03-27 DIAGNOSIS — F1090 Alcohol use, unspecified, uncomplicated: Secondary | ICD-10-CM

## 2022-03-27 DIAGNOSIS — R932 Abnormal findings on diagnostic imaging of liver and biliary tract: Secondary | ICD-10-CM | POA: Diagnosis not present

## 2022-03-27 DIAGNOSIS — R748 Abnormal levels of other serum enzymes: Secondary | ICD-10-CM

## 2022-03-27 DIAGNOSIS — K760 Fatty (change of) liver, not elsewhere classified: Secondary | ICD-10-CM

## 2022-03-27 DIAGNOSIS — Z789 Other specified health status: Secondary | ICD-10-CM

## 2022-03-27 DIAGNOSIS — R1012 Left upper quadrant pain: Secondary | ICD-10-CM

## 2022-03-27 LAB — HEPATIC FUNCTION PANEL
ALT: 19 U/L (ref 0–53)
AST: 15 U/L (ref 0–37)
Albumin: 4.7 g/dL (ref 3.5–5.2)
Alkaline Phosphatase: 56 U/L (ref 39–117)
Bilirubin, Direct: 0.1 mg/dL (ref 0.0–0.3)
Total Bilirubin: 0.6 mg/dL (ref 0.2–1.2)
Total Protein: 7.5 g/dL (ref 6.0–8.3)

## 2022-03-27 NOTE — Progress Notes (Signed)
HPI :  51 year old male known to me from prior colonoscopy in 2021, referred back here for evaluation of abnormal liver imaging and elevated liver enzymes.  He caught me up with his medical history since have last seen him.  He was admitted to the hospital at the end of February at [facility].  States he was found down in his car at a gas station unresponsive, he thinks for roughly 6 hours before police officer found him, broken the car and called EMT.  He was found to be not breathing with pinpoint pupils, was given reportedly Narcan, had a seizure, was intubated.  He tells me he was in the hospital for 7 days, on the ventilator for 4 of them.  He states he had been drinking alcohol routinely at that time and his blood sugars are very poorly controlled which he thinks led to this issue.  He states he was told he had a fentanyl overdose but is adamant that he is never ever taken that in his life.  He states his drug tox was taken in the ED after he was given a dose of fentanyl in the ED when they were trying to sedate him.  Regardless, he was in the hospital for several days.  He was noted to have some elevated liver enzymes with transaminases initially to the 500s with AST greater than ALT.  This was in the setting of have CK elevations into the thousands.  His liver enzymes down trended to normal with resuscitation.  He had an ultrasound of his liver during that admission showing hepatosteatosis and hepatomegaly.  He also had a CT scan of the chest abdomen pelvis, there was no reported abnormality of the liver.  He states since his hospitalization he had some left upper quadrant discomfort to epigastric discomfort.  He states he initially felt a "lump" in that area with a sense of numbness and tenderness to palpation.  Over the past several weeks this has eventually dissipated and has mostly resolved at this point.  As part of this work-up he had a follow-up ultrasound on March 30 by his primary care.   Report is below.  Found to have "2 hypoechoic masses" in his liver that may be hemangiomas.  No other concerning abnormality.  He did not have any pathology noted to cause his symptoms.  He denies any nausea or vomiting, is eating well.  Moving his bowels okay.  Does not take any medications.  He has had fatty liver noted historically on imaging.  Denies any jaundice or symptoms of cirrhosis historically.  He does endorse significant alcohol abuse routinely for some time.  Typically had drink about a sixpack of beer and 3 shots of liquor on a daily basis, perhaps more on the weekends.  He has done this for a long time but states since February he has been sober and does not drink anymore.  He denies any family history of liver disease or family history of cirrhosis.    [place] complete 02/05/22: IMPRESSION: 1. Two hyperechoic masses are identified in the right hepatic lobe measuring up to 16 in 20 mm respectively. These 2 hyperechoic masses most likely represent hemangiomas. 2. 6 mm nonobstructive stone in the right kidney. 3. No other abnormalities identified.  CT C/A/P 01/04/22: IMPRESSION:  1. Tiny bilateral pleural effusions with adjacent atelectasis or infiltrate  2. Nonobstructing bilateral renal stones   US liver 01/04/22: IMPRESSION:  1.  Hepatic steatosis and hepatomegaly.  2.  Borderline renomegaly.  3.  Small right-sided renal calculus. No evidence of obstruction.    CT scan CAP 11/01/19: IMPRESSION: 1. Multiple nonobstructive calculi in the collecting systems of both kidneys measuring up to 3 mm in the lower pole collecting system of the right kidney. No ureteral stones or findings of urinary tract obstruction are noted at this time. 2. Hepatic steatosis. 3. Colonic diverticulosis without evidence of acute diverticulitis at this time.    Case done at hospital for reported difficult airway Colonoscopy 09/10/2020:- The examined portion of the ileum was normal. - One 10 mm  polyp at the ileocecal valve, removed with a cold snare. Resected and retrieved. - One 5 mm polyp in the ascending colon, removed with a cold snare. Resected and retrieved. - One 4 mm polyp at the splenic flexure, removed with a cold snare. Resected and retrieved. - Diverticulosis in the ascending colon. - Internal hemorrhoids. - The examination was otherwise normal.  FINAL MICROSCOPIC DIAGNOSIS:   A. IC VALVE, POLYPECTOMY:  - Tubular adenoma(s) without high-grade dysplasia or malignancy   B. COLON, ASCENDING, SPLENIC FLEXURE, POLYPECTOMY:  - Sessile serrated polyp without cytologic dysplasia  - Hyperplastic polyp    Repeat exam in 3 years recommended  Past Medical History:  Diagnosis Date   Arthritis    Back pain    lower   Dizziness    Dizzy spells    " work injury hit with piece of steel feel dizzy and wavy feeling without pain hit with steel 2 months ago"   Elevated liver enzymes 06/2018   GERD (gastroesophageal reflux disease)    Headache    High cholesterol    History of kidney stones    Poor balance    Prediabetes    Substance abuse ([place])    Drinking struggles   Umbilical hernia      Past Surgical History:  Procedure Laterality Date   COLONOSCOPY     over 10 yrs   COLONOSCOPY WITH PROPOFOL N/A 09/10/2020   Procedure: COLONOSCOPY WITH PROPOFOL;  Surgeon: Yetta Flock, MD;  Location: [facility];  Service: Gastroenterology;  Laterality: N/A;   colonscopy and endoscopy  12 yrs ago   HERNIA REPAIR Bilateral age 76   inguinal    POLYPECTOMY  09/10/2020   Procedure: POLYPECTOMY;  Surgeon: Yetta Flock, MD;  Location: [facility];  Service: Gastroenterology;;   UMBILICAL HERNIA REPAIR N/A 01/10/2019   Procedure: LAPAROSCOPIC ASSISTED REPAIR OF UMBILICAL HERNIA WITH MESH ERAS PATHWAY;  Surgeon: Greer Pickerel, MD;  Location: [facility];  Service: General;  Laterality: N/A;   UPPER GASTROINTESTINAL ENDOSCOPY     over 10 yrs   wisdom teeth extraction      Family History  Problem Relation Age of Onset   Diabetes Father    Colon cancer Neg Hx    Colon polyps Neg Hx    Esophageal cancer Neg Hx    Rectal cancer Neg Hx    Prostate cancer Neg Hx    Social History   Tobacco Use   Smoking status: Former    Packs/day: 0.50    Types: Cigars, Cigarettes    Quit date: 01/03/2022    Years since quitting: 0.2   Smokeless tobacco: Never  Vaping Use   Vaping Use: Never used  Substance Use Topics   Alcohol use: Yes    Comment: 4-5 drinks per day,    Drug use: Yes    Types: Marijuana    Comment: marijuana last used Sep 08, 2018  Current Outpatient Medications  Medication Sig Dispense Refill   blood glucose meter kit and supplies KIT Dispense based on patient and insurance preference. Use up to four times daily as directed. Please include lancets, test strips, control solution. 1 each 0   Blood Glucose Monitoring Suppl (BLOOD GLUCOSE MONITOR SYSTEM) w/Device KIT Check blood sugar once daily as directed 1 kit 0   Continuous Blood Gluc Sensor (FREESTYLE LIBRE 3 SENSOR) MISC Place 1 sensor on the skin every 14 days. Use to check glucose continuously 2 each 11   glucose blood (FREESTYLE LITE) test strip Use daily as directed 100 each 0   Lancets (FREESTYLE) lancets Use daily as directed 100 each 0   losartan (COZAAR) 100 MG tablet Take 1 tablet (100 mg dose) by mouth daily. 30 tablet 0   metFORMIN (GLUCOPHAGE) 1000 MG tablet Take 1 tablet (1,000 mg dose) by mouth 2 (two) times daily with meals. 60 tablet 0   omeprazole (PRILOSEC) 40 MG capsule Take 1 capsule  by mouth daily before breakfast. (Patient taking differently: Take 40 mg by mouth daily as needed.) 90 capsule 1   No current facility-administered medications for this visit.   Allergies  Allergen Reactions   Codone [Hydrocodone] Itching     Review of Systems: All systems reviewed and negative except where noted in HPI.      Physical Exam: BP 116/76   Pulse 72   Ht 6' (1.829  m)   Wt 221 lb 9.6 oz (100.5 kg)   SpO2 99%   BMI 30.05 kg/m  Constitutional: Pleasant,well-developed, male in no acute distress. Abdominal: Soft, nondistended, nontender. There are no masses palpable.  Extremities: no edema Neurological: Alert and oriented to person place and time. Skin: Skin is warm and dry. No rashes noted. Psychiatric: Normal mood and affect. Behavior is normal.   ASSESSMENT AND PLAN: 51 year old male here for reassessment of the following:  Abnormal liver imaging Left upper quadrant pain Elevated liver enzymes Fatty liver Alcohol use  As above, patient found down in his car after what he thinks was 6 hours or so slumped over his steering wheel.  Was admitted to the hospital for prolonged hospital stay for respiratory arrest, had aspiration pneumonia etc.  He thinks a combination of alcohol and hyperglycemia causes symptoms, adamantly denied he ever used fentanyl.  I think his left upper to epigastric pain was likely musculoskeletal from being slouched over his steering well for so long.  His imaging showed no cause for this otherwise.  It is getting better with time, he will keep an eye on this and if recurs or causes problems he will let us know.  He has baseline fatty liver disease likely from his alcohol use.  His liver enzymes were elevated in the hospital, could been related to muscle breakdown given his CKs were in the thousands, or perhaps from shock liver.  He has had some intermittently elevated liver enzymes over time.  He has never been screened for hep B&C and we will do that.  His most recent ultrasound which was obtained initially to evaluate his upper abdominal discomfort, shows what appear to be hemangiomas of his liver but not definitive.  I offered him a liver MRI to make sure there is nothing concerning there as he is anxious about this.  He strongly wanted to have this done.  If it confirms hemangiomas I counseled him no further surveillance is  likely warranted.  We otherwise discussed his alcohol abuse in the past,  fatty liver, risks for cirrhosis and fibrosis moving forward.  He is abstained from alcohol since his hospitalization, does not plan on resuming.  I do not think he has any underlying cirrhosis given his work-up to date, spleen appears normal, but he understands his risks of cirrhosis if he continues to use alcohol the way he has in the past.  I will recheck his liver enzymes today to make sure normal, also screen for hepatitis B and C, and check immunity to have a and B and vaccinate if needed.  He can follow-up with me yearly for fatty liver otherwise.  Plan: - abstain from alcohol - lab today - LFTs - hep C antibody, hep B surface antigen, hep B surface antibody, hep A total AB - MRI liver with and without contrast - suspect abdominal discomfort likely musculoskeletal injury, improving, he will keep an eye on this.  Jolly Mango, MD [facility]

## 2022-03-27 NOTE — Patient Instructions (Signed)
If you are age 53 or older, your body mass index should be between 23-30. Your Body mass index is 30.05 kg/m. If this is out of the aforementioned range listed, please consider follow up with your Primary Care Provider.  If you are age 73 or younger, your body mass index should be between 19-25. Your Body mass index is 30.05 kg/m. If this is out of the aformentioned range listed, please consider follow up with your Primary Care Provider.   ________________________________________________________  The [facility] providers would like to encourage you to use [facility] to communicate with providers for non-urgent requests or questions.  Due to long hold times on the telephone, sending your provider a message by [facility] may be a faster and more efficient way to get a response.  Please allow 48 business hours for a response.  Please remember that this is for non-urgent requests.  _______________________________________________________  Please go to the lab in the basement of our building to have lab work done as you leave today. Hit "B" for basement when you get on the elevator.  When the doors open the lab is on your left.  We will call you with the results. Thank you.   You have been scheduled for an MRI at [facility] on Saturday, 04-04-22. Your appointment time is 9:00am. Please arrive to admitting (at main entrance of the hospital) 15 minutes prior to your appointment time for registration purposes. Please make certain not to have anything to eat or drink 6 hours prior to your test. In addition, if you have any metal in your body, have a pacemaker or defibrillator, please be sure to let your ordering physician know. This test typically takes 45 minutes to 1 hour to complete. Should you need to reschedule, please call [phone] to do so.   Thank you for entrusting me with your care and for choosing [facility], Dr. Refton Cellar

## 2022-03-30 ENCOUNTER — Encounter: Payer: Self-pay | Admitting: Gastroenterology

## 2022-03-30 LAB — HEPATITIS A ANTIBODY, TOTAL: Hepatitis A AB,Total: REACTIVE — AB

## 2022-03-30 LAB — HEPATITIS B SURFACE ANTIGEN: Hepatitis B Surface Ag: NONREACTIVE

## 2022-03-30 LAB — HEPATITIS B CORE ANTIBODY, TOTAL: Hep B Core Total Ab: NONREACTIVE

## 2022-03-30 LAB — HEPATITIS B SURFACE ANTIBODY,QUALITATIVE: Hep B S Ab: NONREACTIVE

## 2022-04-04 ENCOUNTER — Ambulatory Visit (HOSPITAL_COMMUNITY)
Admission: RE | Admit: 2022-04-04 | Discharge: 2022-04-04 | Disposition: A | Discharge status: Home / Self Care | Payer: 59 | Source: Ambulatory Visit | Attending: Gastroenterology | Admitting: Gastroenterology

## 2022-04-04 DIAGNOSIS — N281 Cyst of kidney, acquired: Secondary | ICD-10-CM | POA: Diagnosis not present

## 2022-04-04 DIAGNOSIS — R932 Abnormal findings on diagnostic imaging of liver and biliary tract: Secondary | ICD-10-CM

## 2022-04-04 DIAGNOSIS — F1090 Alcohol use, unspecified, uncomplicated: Secondary | ICD-10-CM

## 2022-04-04 DIAGNOSIS — K769 Liver disease, unspecified: Secondary | ICD-10-CM | POA: Diagnosis not present

## 2022-04-04 DIAGNOSIS — R1012 Left upper quadrant pain: Secondary | ICD-10-CM

## 2022-04-04 DIAGNOSIS — R748 Abnormal levels of other serum enzymes: Secondary | ICD-10-CM | POA: Diagnosis not present

## 2022-04-04 DIAGNOSIS — Z789 Other specified health status: Secondary | ICD-10-CM | POA: Diagnosis not present

## 2022-04-04 DIAGNOSIS — D1803 Hemangioma of intra-abdominal structures: Secondary | ICD-10-CM | POA: Diagnosis not present

## 2022-04-04 IMAGING — MR MR ABDOMEN WO/W CM
19 series · 48 of 48 positions shown · IV contrast (10 GADAVIST)
Comparison: No prior abdominal MRI. Abdominal ultrasound
[DATE]. CT the abdomen and pelvis [DATE].

CLINICAL DATA: 50-year-old male with history of indeterminate liver
lesions noted on prior ultrasound examination. Follow-up study.

EXAM:
MRI ABDOMEN WITHOUT AND WITH CONTRAST
TECHNIQUE: Multiplanar multisequence MR imaging of the abdomen was performed
both before and after the administration of intravenous contrast.
CONTRAST:  10mL GADAVIST GADOBUTROL 1 MMOL/ML IV SOLN

[Series 3: T2 · coronal · 6.0mm · 1.56mm/px · 2 of 36 slices shown (1 of 2)]
[im 1/36]
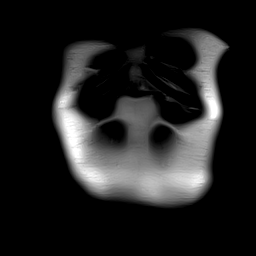
[im 36/36]
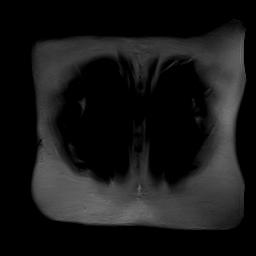

[Series 4: T2 fat-sat · 2 of 7 slices shown (1 of 2)]
[im 1/7]
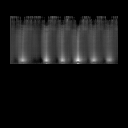
[im 7/7]
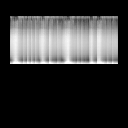

[Series 5: T2 fat-sat · axial · 6.0mm · 1.25mm/px · 1 of 46 slices shown (2 of 2)]
[im 1/46]
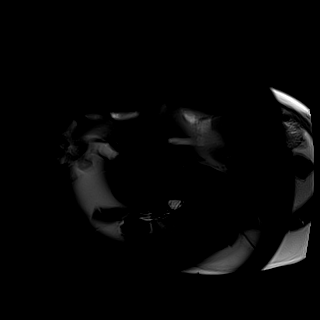

[Series 6: T1 · axial · 3.0mm · 1.25mm/px · z∈[-146,+163]mm · 3 of 104 slices shown (1 of 2)]
[im 1/104]
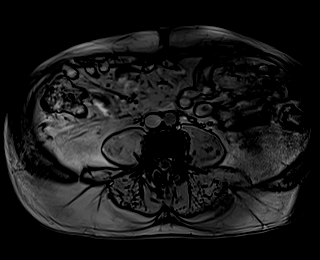
[im 52/104]
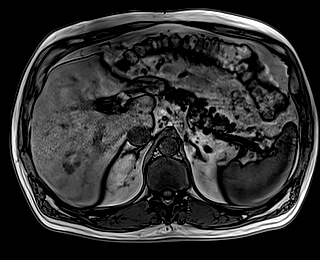
[im 104/104]
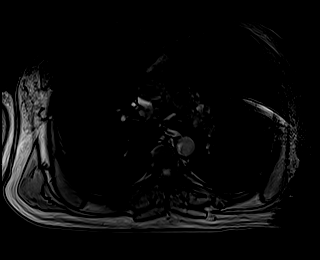

[Series 7: T1 · axial · 3.0mm · 1.25mm/px · z∈[-146,+163]mm · 3 of 104 slices shown (2 of 2)]
[im 1/104]
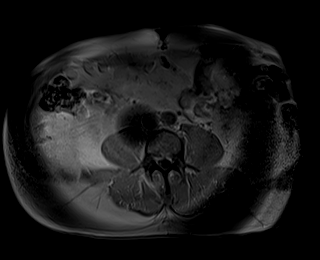
[im 52/104]
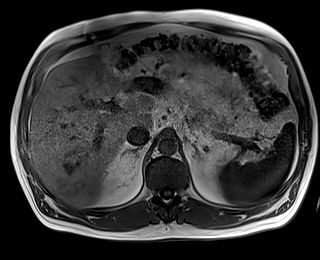
[im 104/104]
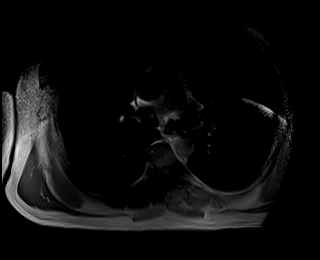

[Series 8: DWI · axial · 6.0mm · 1.49mm/px · z∈[-140,+169]mm · 3 of 88 slices shown (1 of 2)]
[im 1/88]
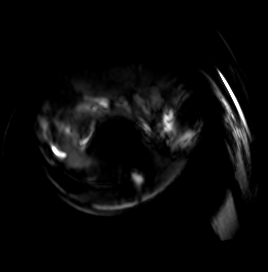
[im 44/88]
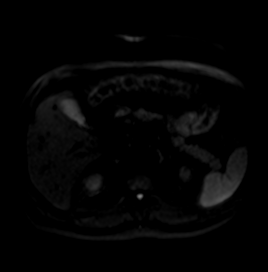
[im 88/88]
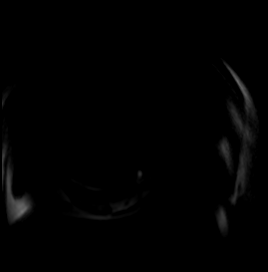

[Series 9: DWI · axial · 6.0mm · 1.49mm/px · 1 of 44 slices shown (2 of 2)]
[im 1/44]
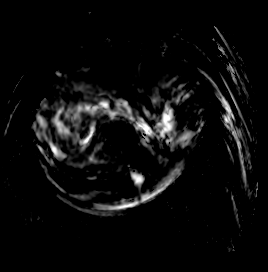

[Series 10: bSSFP · axial · 4.0mm · 0.84mm/px · z∈[-151,+165]mm · 2 of 80 slices shown]
[im 1/80]
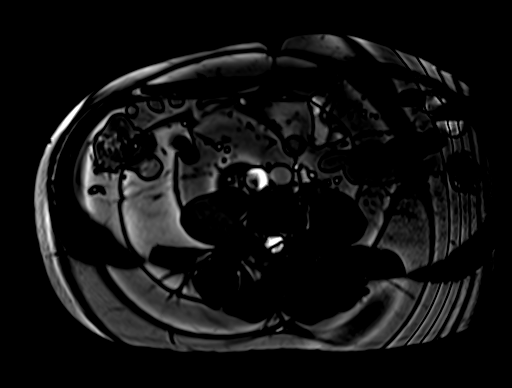
[im 80/80]
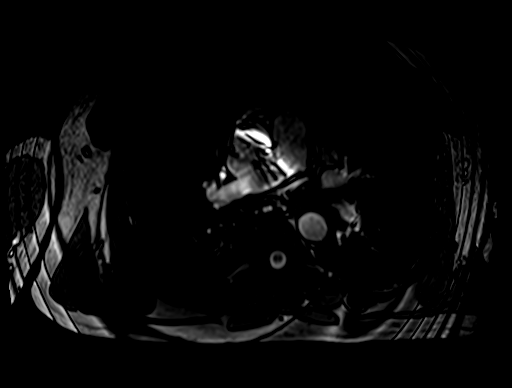

[Series 12: T1 dynamic · axial · 3.0mm · 1.25mm/px · z∈[-149,+160]mm · 3 of 104 slices shown (1 of 10)]
[im 1/104]
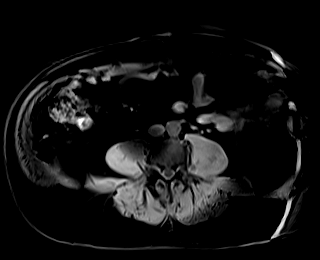
[im 52/104]
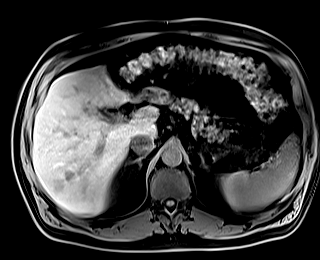
[im 104/104]
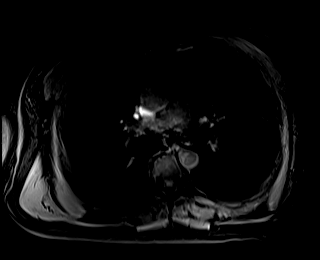

[Series 16: T1 dynamic · axial · 3.0mm · 1.25mm/px · z∈[-149,+160]mm · 3 of 104 slices shown (2 of 10)]
[im 1/104]
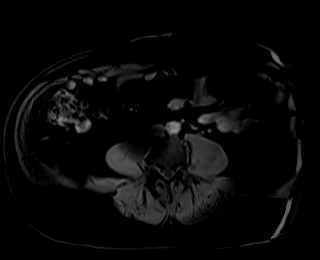
[im 52/104]
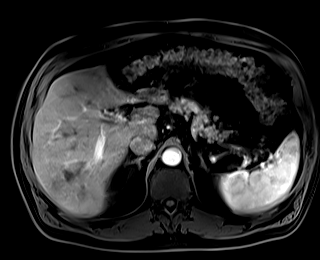
[im 104/104]
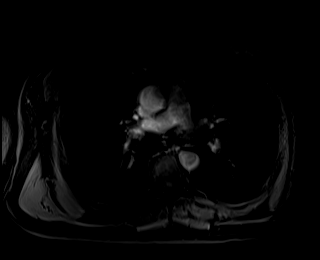

[Series 17: T1 dynamic · axial · 3.0mm · 1.25mm/px · z∈[-149,+160]mm · 3 of 104 slices shown (3 of 10)]
[im 1/104]
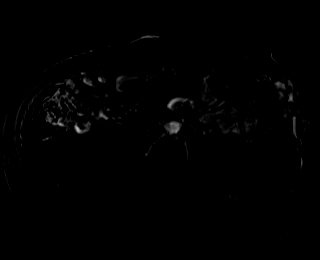
[im 52/104]
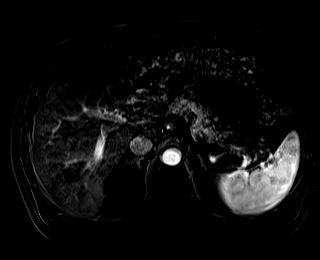
[im 104/104]
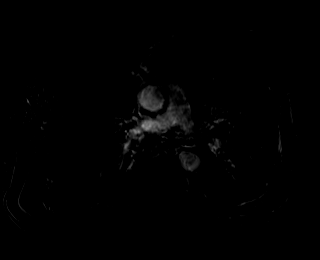

[Series 20: T1 dynamic · axial · 3.0mm · 1.25mm/px · z∈[-149,+160]mm · 3 of 104 slices shown (4 of 10)]
[im 1/104]
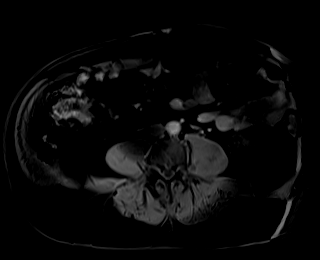
[im 52/104]
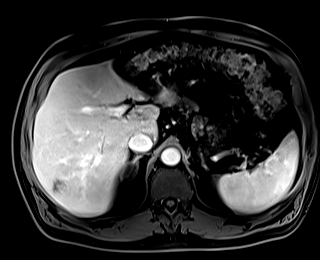
[im 104/104]
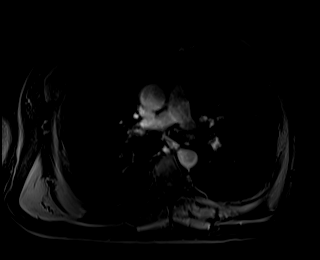

[Series 21: T1 dynamic · axial · 3.0mm · 1.25mm/px · z∈[-149,+160]mm · 3 of 104 slices shown (5 of 10)]
[im 1/104]
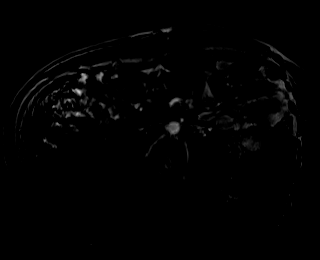
[im 52/104]
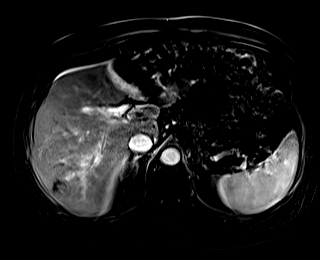
[im 104/104]
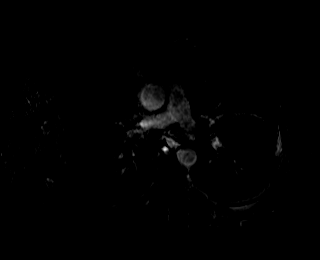

[Series 24: T1 dynamic · axial · 3.0mm · 1.25mm/px · z∈[-149,+160]mm · 3 of 104 slices shown (6 of 10)]
[im 1/104]
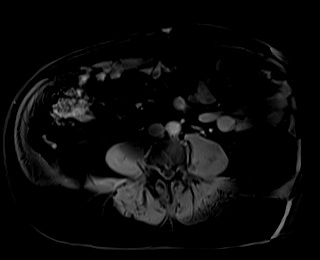
[im 52/104]
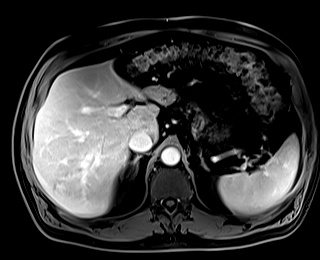
[im 104/104]
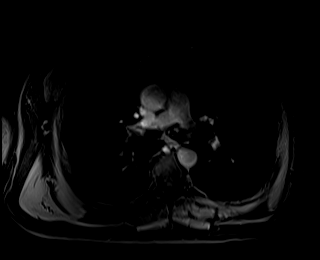

[Series 25: T1 dynamic · axial · 3.0mm · 1.25mm/px · z∈[-149,+160]mm · 3 of 104 slices shown (7 of 10)]
[im 1/104]
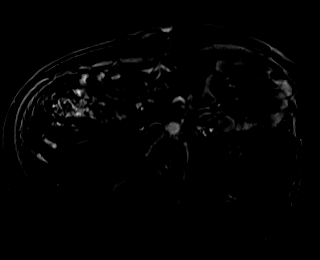
[im 52/104]
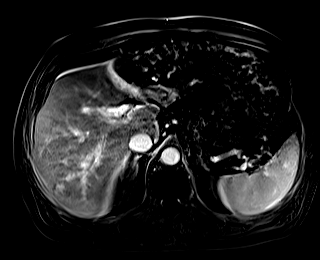
[im 104/104]
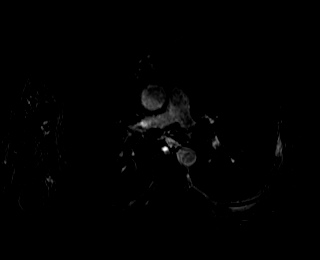

[Series 27: T1 dynamic · coronal · 3.0mm · 1.41mm/px · 3 of 88 slices shown (8 of 10)]
[im 1/88]
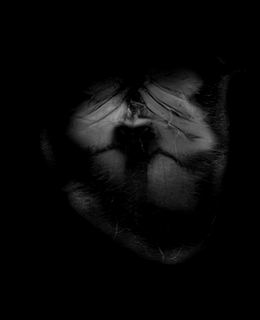
[im 44/88]
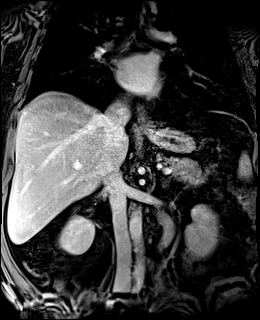
[im 88/88]
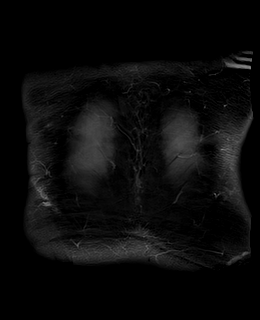

[Series 28: T2 · axial · 6.0mm · 1.56mm/px · 1 of 48 slices shown (2 of 2)]
[im 1/48]
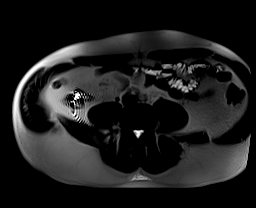

[Series 31: T1 dynamic · axial · 3.0mm · 1.25mm/px · z∈[-149,+160]mm · 3 of 104 slices shown (9 of 10)]
[im 1/104]
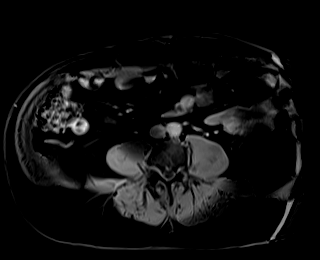
[im 52/104]
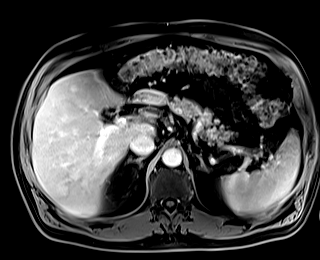
[im 104/104]
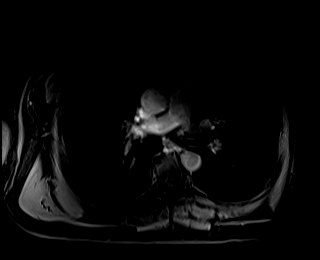

[Series 32: T1 dynamic · axial · 3.0mm · 1.25mm/px · z∈[-149,+160]mm · 3 of 104 slices shown (10 of 10)]
[im 1/104]
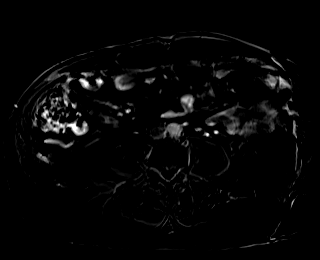
[im 52/104]
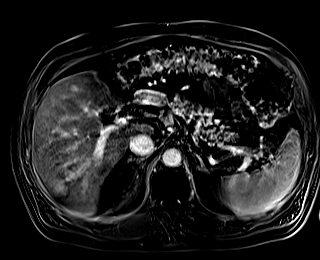
[im 104/104]
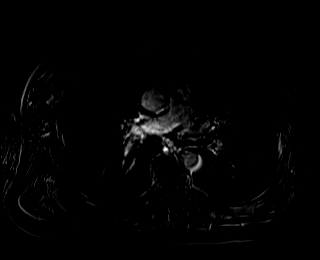

[48 of 48 positions shown; findings below may reference images not displayed]

FINDINGS: Lower chest: Unremarkable.

Hepatobiliary: In the right lobe of the liver there are 2 lesions
which are T1 hypointense, T2 hyperintense and demonstrate peripheral
nodular hyperenhancement during early post gadolinium imaging with
progressive centripetal filling, diagnostic of cavernous
hemangiomas. The largest of these measures 2.1 x 2.1 cm (axial image
18 of series 5) in segment 7, while the smaller lesion measures
x 1.5 cm in segment 6 (axial image 21 of series 5). No other
suspicious appearing hepatic lesions are noted. No intra or
extrahepatic biliary ductal dilatation. Gallbladder is unremarkable
in appearance. Common bile duct measures 2 mm in the porta hepatis.
No filling defect in the common bile duct to suggest
choledocholithiasis.

Pancreas: No pancreatic mass. No pancreatic ductal dilatation. No
pancreatic or peripancreatic fluid collections or inflammatory
changes.

Spleen:  Unremarkable.

Adrenals/Urinary Tract: Tiny T1 hyperintense and T2 hypointense,
nonenhancing lesions in the right kidney are compatible with small
subcentimeter proteinaceous/hemorrhagic cysts (Bosniak class 2).
There is also a subcentimeter simple cyst (Bosniak class 1) in the
lower pole of the right kidney. Left kidney and bilateral adrenal
glands are normal in appearance. No hydroureteronephrosis in the
visualized portions of the abdomen.

Stomach/Bowel: Visualized portions are unremarkable.

Vascular/Lymphatic: No aneurysm identified in the visualized
abdominal vasculature. No lymphadenopathy noted in the abdomen.

Other: No significant volume of ascites noted in the visualized
portions of the peritoneal cavity.

Musculoskeletal: No aggressive appearing osseous lesions are noted
in the visualized portions of the skeleton.
IMPRESSION: 1. The lesions of concern in the liver have imaging characteristics
compatible with benign cavernous hemangiomas.
2. Additional incidental findings, as above.

## 2022-04-04 MED ORDER — GADOBUTROL 1 MMOL/ML IV SOLN
10.0000 mL | Freq: Once | INTRAVENOUS | Status: AC | PRN
  Administered 2022-04-04: 10 mL via INTRAVENOUS

## 2022-04-09 ENCOUNTER — Encounter: Payer: Self-pay | Admitting: *Deleted

## 2022-04-09 ENCOUNTER — Telehealth: Payer: Self-pay | Admitting: Family Medicine

## 2022-04-09 ENCOUNTER — Other Ambulatory Visit: Payer: Self-pay | Admitting: *Deleted

## 2022-04-09 DIAGNOSIS — E119 Type 2 diabetes mellitus without complications: Secondary | ICD-10-CM

## 2022-04-09 DIAGNOSIS — I10 Essential (primary) hypertension: Secondary | ICD-10-CM

## 2022-04-09 NOTE — Telephone Encounter (Signed)
Pt would like to come in before his appt to get labs done. Please advise.

## 2022-04-09 NOTE — Telephone Encounter (Signed)
Sent pt a msg on mychart.  It is too early to check A1c.

## 2022-04-10 NOTE — Telephone Encounter (Signed)
Pt rescheduled for the end of June.

## 2022-04-14 ENCOUNTER — Ambulatory Visit: Payer: 59 | Admitting: Family Medicine

## 2022-05-04 ENCOUNTER — Other Ambulatory Visit (HOSPITAL_COMMUNITY): Payer: Self-pay

## 2022-05-04 ENCOUNTER — Other Ambulatory Visit: Payer: Self-pay | Admitting: Family Medicine

## 2022-05-04 MED ORDER — FREESTYLE LANCETS MISC
99 refills | Status: DC
  Filled 2022-05-04 – 2022-05-05 (×2): qty 100, 90d supply, fill #0

## 2022-05-05 ENCOUNTER — Other Ambulatory Visit (HOSPITAL_COMMUNITY): Payer: Self-pay

## 2022-05-05 ENCOUNTER — Other Ambulatory Visit: Payer: Self-pay | Admitting: Family Medicine

## 2022-05-05 MED ORDER — METFORMIN HCL 1000 MG PO TABS
ORAL_TABLET | ORAL | 0 refills | Status: DC
  Filled 2022-05-05: qty 60, 30d supply, fill #0

## 2022-05-07 NOTE — Progress Notes (Deleted)
   Established Patient Office Visit  Subjective   Patient ID: Brian Pierce, male    DOB: 1971-09-02  Age: 51 y.o. MRN: [id number]  No chief complaint on file.   HPI Patient presents for diabetes follow-up.    DIABETES: - Checking BG at home: ***; last A1c was 8.5% (02/03/22) - Medications: metformin 1,000 mg BID - Compliance: *** - Diet: *** - Exercise: *** - Eye exam: *** - Foot exam: *** - Microalbumin: *** - Denies symptoms of hypoglycemia, polyuria, polydipsia, numbness extremities, foot ulcers/trauma, wounds that are not healing, medication side effects   HYPERTENSION: - Medications: losartan 100 mg daily - Compliance: *** - Checking BP at home: *** - Denies any SOB, recurrent headaches, CP, vision changes, LE edema, dizziness, palpitations, or medication side effects. - Diet: *** - Exercise: *** - Stressors:     {History (Optional):23778}  ROS    Objective:     There were no vitals taken for this visit. {Vitals History (Optional):23777}  Physical Exam   No results found for any visits on 05/08/22.  {Labs (Optional):23779}  The ASCVD Risk score (Arnett DK, et al., 2019) failed to calculate for the following reasons:   The patient has a prior MI or stroke diagnosis    Assessment & Plan:   Problem List Items Addressed This Visit   None   No follow-ups on file.    Terrilyn Saver, NP

## 2022-05-08 ENCOUNTER — Ambulatory Visit: Payer: 59 | Admitting: Family Medicine

## 2022-05-18 ENCOUNTER — Other Ambulatory Visit (HOSPITAL_COMMUNITY): Payer: Self-pay

## 2022-05-19 ENCOUNTER — Other Ambulatory Visit (HOSPITAL_COMMUNITY): Payer: Self-pay

## 2022-08-31 ENCOUNTER — Ambulatory Visit (INDEPENDENT_AMBULATORY_CARE_PROVIDER_SITE_OTHER): Payer: 59 | Admitting: Family

## 2022-08-31 ENCOUNTER — Encounter: Payer: Self-pay | Admitting: Family

## 2022-08-31 VITALS — BP 138/75 | HR 65 | Temp 97.9°F | Resp 16 | Wt 239.0 lb

## 2022-08-31 DIAGNOSIS — M25512 Pain in left shoulder: Secondary | ICD-10-CM | POA: Diagnosis not present

## 2022-08-31 DIAGNOSIS — M25412 Effusion, left shoulder: Secondary | ICD-10-CM | POA: Insufficient documentation

## 2022-08-31 DIAGNOSIS — E119 Type 2 diabetes mellitus without complications: Secondary | ICD-10-CM

## 2022-08-31 LAB — LIPID PANEL
Cholesterol: 173 mg/dL (ref 0–200)
HDL: 51.8 mg/dL (ref >39.00)
NonHDL: 121.11
Total CHOL/HDL Ratio: 3
Triglycerides: 318 mg/dL — ABNORMAL HIGH (ref 0.0–149.0)
VLDL: 63.6 mg/dL — ABNORMAL HIGH (ref 0.0–40.0)

## 2022-08-31 LAB — COMPREHENSIVE METABOLIC PANEL
ALT: 47 U/L (ref 0–53)
AST: 24 U/L (ref 0–37)
Albumin: 4.4 g/dL (ref 3.5–5.2)
Alkaline Phosphatase: 67 U/L (ref 39–117)
BUN: 20 mg/dL (ref 6–23)
CO2: 25 mEq/L (ref 19–32)
Calcium: 9.2 mg/dL (ref 8.4–10.5)
Chloride: 102 mEq/L (ref 96–112)
Creatinine, Ser: 1.07 mg/dL (ref 0.40–1.50)
GFR: 79.5 mL/min (ref >60.00)
Glucose, Bld: 241 mg/dL — ABNORMAL HIGH (ref 70–99)
Potassium: 4.4 mEq/L (ref 3.5–5.1)
Sodium: 135 mEq/L (ref 135–145)
Total Bilirubin: 0.5 mg/dL (ref 0.2–1.2)
Total Protein: 7 g/dL (ref 6.0–8.3)

## 2022-08-31 LAB — MICROALBUMIN / CREATININE URINE RATIO
Creatinine,U: 120.9 mg/dL
Microalb Creat Ratio: 0.8 mg/g (ref 0.0–30.0)
Microalb, Ur: 1 mg/dL (ref 0.0–1.9)

## 2022-08-31 LAB — LDL CHOLESTEROL, DIRECT: Direct LDL: 81 mg/dL

## 2022-08-31 LAB — HEMOGLOBIN A1C: Hgb A1c MFr Bld: 6.4 % (ref 4.6–6.5)

## 2022-08-31 MED ORDER — MELOXICAM 7.5 MG PO TABS: 7.5000 mg | ORAL_TABLET | Freq: Every day | ORAL | 0 refills | Status: DC

## 2022-08-31 NOTE — Assessment & Plan Note (Signed)
New, uncontrolled. Will plan treatment with meloxicam and referral to sports medicine for further evaluation.

## 2022-08-31 NOTE — Assessment & Plan Note (Addendum)
Encouraged pt to schedule DM eye exam annually.  Obtain A1C.  Discussed if A1C >7 would recommend that he restart metformin. We also discussed the benefit of statin therapy if lipids are elevated. He declines statin therapy.

## 2022-08-31 NOTE — Progress Notes (Signed)
Subjective:     Patient ID: Brian Pierce, male    DOB: 02-06-1971, 51 y.o.   MRN: [id number]  Chief Complaint  Patient presents with   Shoulder Injury    Complains of left shoulder pain since injury at work a week ago    Shoulder Injury    Patient is in today for since last Tuesday.  Reports that he was reaching down into a crate to lift a 30+ pound part and felt something "pop" in the left shoulder. He subsequently dropped the part. He has been using ibuprofen and naproxen with minimal improvement.  Hurts more when he is driving or at rest. Pain is not completely unbearable.  Sometimes it radiates down into his left hand.  He has felt some mild left sided neck pain.     DM2- reports that he has been checking his sugar about 3x a week most readings: 100-105.  He decided to stop his metformin because he sugars were good.   Wt Readings from Last 3 Encounters:  08/31/22 239 lb (108.4 kg)  03/27/22 221 lb 9.6 oz (100.5 kg)  02/04/22 220 lb 3.2 oz (99.9 kg)    Lab Results  Component Value Date   HGBA1C 8.5 (H) 02/03/2022   Lab Results  Component Value Date   CREATININE 1.11 02/03/2022   Declines flu shot.   Health Maintenance Due  Topic Date Due   COVID-19 Vaccine (1) Never done   FOOT EXAM  Never done   OPHTHALMOLOGY EXAM  Never done   HIV Screening  Never done   Hepatitis C Screening  Never done   Zoster Vaccines- Shingrix (1 of 2) Never done   Diabetic kidney evaluation - Urine ACR  05/01/2021   HEMOGLOBIN A1C  08/06/2022    Past Medical History:  Diagnosis Date   Arthritis    Back pain    lower   Dizziness    Dizzy spells    " work injury hit with piece of steel feel dizzy and wavy feeling without pain hit with steel 2 months ago"   Elevated liver enzymes 06/2018   GERD (gastroesophageal reflux disease)    Headache    High cholesterol    History of kidney stones    Poor balance    Prediabetes    Substance abuse ([place])    Drinking struggles    Umbilical hernia     Past Surgical History:  Procedure Laterality Date   COLONOSCOPY     over 10 yrs   COLONOSCOPY WITH PROPOFOL N/A 09/10/2020   Procedure: COLONOSCOPY WITH PROPOFOL;  Surgeon: Yetta Flock, MD;  Location: [facility];  Service: Gastroenterology;  Laterality: N/A;   colonscopy and endoscopy  12 yrs ago   HERNIA REPAIR Bilateral age 52   inguinal    POLYPECTOMY  09/10/2020   Procedure: POLYPECTOMY;  Surgeon: Yetta Flock, MD;  Location: [facility];  Service: Gastroenterology;;   UMBILICAL HERNIA REPAIR N/A 01/10/2019   Procedure: LAPAROSCOPIC ASSISTED REPAIR OF UMBILICAL HERNIA WITH MESH ERAS PATHWAY;  Surgeon: Greer Pickerel, MD;  Location: [facility];  Service: General;  Laterality: N/A;   UPPER GASTROINTESTINAL ENDOSCOPY     over 10 yrs   wisdom teeth extraction      Family History  Problem Relation Age of Onset   Diabetes Father    Colon cancer Neg Hx    Colon polyps Neg Hx    Esophageal cancer Neg Hx    Rectal cancer Neg Hx  Prostate cancer Neg Hx     Social History   Socioeconomic History   Marital status: Married    Spouse name: Barnetta Chapel   Number of children: 3   Years of education: Not on file   Highest education level: High school graduate  Occupational History    Comment: landscaping  Tobacco Use   Smoking status: Former    Packs/day: 0.50    Types: Cigars, Cigarettes    Quit date: 01/03/2022    Years since quitting: 0.6   Smokeless tobacco: Never  Vaping Use   Vaping Use: Never used  Substance and Sexual Activity   Alcohol use: Yes    Comment: 4-5 drinks per day,    Drug use: Yes    Types: Marijuana    Comment: marijuana last used Sep 08, 2018   Sexual activity: Not on file  Other Topics Concern   Not on file  Social History Narrative   Lives with spouse   caffeine- 2-3 cups daily   Social Determinants of Health   Financial Resource Strain: Not on file  Food Insecurity: Not on file  Transportation Needs: Not on  file  Physical Activity: Not on file  Stress: Not on file  Social Connections: Not on file  Intimate Partner Violence: Not on file    Outpatient Medications Prior to Visit  Medication Sig Dispense Refill   omeprazole (PRILOSEC) 40 MG capsule Take 1 capsule  by mouth daily before breakfast. (Patient taking differently: Take 40 mg by mouth daily as needed.) 90 capsule 1   blood glucose meter kit and supplies KIT Dispense based on patient and insurance preference. Use up to four times daily as directed. Please include lancets, test strips, control solution. 1 each 0   Blood Glucose Monitoring Suppl (BLOOD GLUCOSE MONITOR SYSTEM) w/Device KIT Check blood sugar once daily as directed 1 kit 0   Continuous Blood Gluc Sensor (FREESTYLE LIBRE 3 SENSOR) MISC Place 1 sensor on the skin every 14 days. Use to check glucose continuously 2 each 11   glucose blood (FREESTYLE LITE) test strip Use daily as directed 100 each 0   Lancets (FREESTYLE) lancets Check blood sugars once daily 100 each PRN   losartan (COZAAR) 100 MG tablet Take 1 tablet (100 mg dose) by mouth daily. 30 tablet 0   metFORMIN (GLUCOPHAGE) 1000 MG tablet Take 1 tablet (1,000 mg dose) by mouth 2 (two) times daily with meals. 60 tablet 0   No facility-administered medications prior to visit.    Allergies  Allergen Reactions   Codone [Hydrocodone] Itching    ROS See HPI    Objective:    Physical Exam Constitutional:      General: He is not in acute distress.    Appearance: He is well-developed.  HENT:     Head: Normocephalic and atraumatic.  Cardiovascular:     Rate and Rhythm: Normal rate and regular rhythm.     Heart sounds: No murmur heard. Pulmonary:     Effort: Pulmonary effort is normal. No respiratory distress.     Breath sounds: Normal breath sounds. No wheezing or rales.  Musculoskeletal:     Comments: Left shoulder tenderness to palpation.  No swelling. Full ROM of the left shoulder.   Skin:    General: Skin  is warm and dry.  Neurological:     Mental Status: He is alert and oriented to person, place, and time.  Psychiatric:        Behavior: Behavior normal.  Thought Content: Thought content normal.     BP 138/75 (BP Location: Right Arm, Patient Position: Sitting, Cuff Size: Large)   Pulse 65   Temp 97.9 F (36.6 C) (Oral)   Resp 16   Wt 239 lb (108.4 kg)   SpO2 99%   BMI 32.41 kg/m  Wt Readings from Last 3 Encounters:  08/31/22 239 lb (108.4 kg)  03/27/22 221 lb 9.6 oz (100.5 kg)  02/04/22 220 lb 3.2 oz (99.9 kg)       Assessment & Plan:   Problem List Items Addressed This Visit       Unprioritized   Type 2 diabetes mellitus without complication, without long-term current use of insulin ([place])    Encouraged pt to schedule DM eye exam annually.  Obtain A1C.  Discussed if A1C >7 would recommend that he restart metformin. We also discussed the benefit of statin therapy if lipids are elevated. He declines statin therapy.        Relevant Orders   Hemoglobin A1c   Comp Met (CMET)   Urine Microalbumin w/creat. ratio   Lipid panel   Left shoulder pain - Primary    New, uncontrolled. Will plan treatment with meloxicam and referral to sports medicine for further evaluation.       Relevant Medications   meloxicam (MOBIC) 7.5 MG tablet   Other Relevant Orders   Ambulatory referral to Sports Medicine    I have discontinued Bonna Gains. [employer] blood glucose meter kit and supplies, FreeStyle Libre 3 Sensor, Blood Glucose Monitor System, FREESTYLE LITE, losartan, freestyle, and metFORMIN. I am also having him start on meloxicam. Additionally, I am having him maintain his omeprazole.  Meds ordered this encounter  Medications   meloxicam (MOBIC) 7.5 MG tablet    Sig: Take 1 tablet (7.5 mg total) by mouth daily.    Dispense:  14 tablet    Refill:  0    Order Specific Question:   Supervising Provider    Answer:   Penni Homans A [[id number]]

## 2022-09-21 ENCOUNTER — Encounter: Payer: Self-pay | Admitting: Family Medicine

## 2022-09-21 ENCOUNTER — Ambulatory Visit (INDEPENDENT_AMBULATORY_CARE_PROVIDER_SITE_OTHER): Payer: 59 | Admitting: Family Medicine

## 2022-09-21 ENCOUNTER — Ambulatory Visit: Payer: Self-pay

## 2022-09-21 VITALS — BP 130/74 | Ht 72.0 in | Wt 240.0 lb

## 2022-09-21 DIAGNOSIS — M25412 Effusion, left shoulder: Secondary | ICD-10-CM

## 2022-09-21 DIAGNOSIS — M25512 Pain in left shoulder: Secondary | ICD-10-CM

## 2022-09-21 NOTE — Progress Notes (Signed)
  Brian Pierce - 51 y.o. male MRN [id number]  Date of birth: 04-01-1971  SUBJECTIVE:  Including CC & ROS.  No chief complaint on file.   Brian Pierce is a 51 y.o. male that is presenting with left shoulder pain.  Initially felt the pain while he was at work picking up a heavy object.  Had a sudden sharp pain.  Slowly has improved over the course of this time which was about 6 weeks ago.    Review of Systems See HPI   HISTORY: Past Medical, Surgical, Social, and Family History Reviewed & Updated per EMR.   Pertinent Historical Findings include:  Past Medical History:  Diagnosis Date   Arthritis    Back pain    lower   Dizziness    Dizzy spells    " work injury hit with piece of steel feel dizzy and wavy feeling without pain hit with steel 2 months ago"   Elevated liver enzymes 06/2018   GERD (gastroesophageal reflux disease)    Headache    High cholesterol    History of kidney stones    Poor balance    Prediabetes    Substance abuse ([place])    Drinking struggles   Umbilical hernia     Past Surgical History:  Procedure Laterality Date   COLONOSCOPY     over 10 yrs   COLONOSCOPY WITH PROPOFOL N/A 09/10/2020   Procedure: COLONOSCOPY WITH PROPOFOL;  Surgeon: Yetta Flock, MD;  [facility];  Service: Gastroenterology;  Laterality: N/A;   colonscopy and endoscopy  12 yrs ago   HERNIA REPAIR Bilateral age 45   inguinal    POLYPECTOMY  09/10/2020   Procedure: POLYPECTOMY;  Surgeon: Yetta Flock, MD;  [facility];  Service: Gastroenterology;;   UMBILICAL HERNIA REPAIR N/A 01/10/2019   Procedure: LAPAROSCOPIC ASSISTED REPAIR OF UMBILICAL HERNIA WITH MESH ERAS PATHWAY;  Surgeon: Greer Pickerel, MD;  Location: [facility];  Service: General;  Laterality: N/A;   UPPER GASTROINTESTINAL ENDOSCOPY     over 10 yrs   wisdom teeth extraction       PHYSICAL EXAM:  VS: BP 130/74   Ht 6' (1.829 m)   Wt 240 lb (108.9 kg)   BMI 32.55 kg/m  Physical  Exam Gen: NAD, alert, cooperative with exam, well-appearing MSK:  Neurovascularly intact    Limited ultrasound: Left shoulder:  There is effusion at the proximal portion of the biceps tendon. Effusion overlying the subscapularis. Overlying bursitis of the supraspinatus.  Summary: Findings suggest effusion in the glenohumeral joint  Ultrasound and interpretation by Clearance Coots, MD    ASSESSMENT & PLAN:   Shoulder effusion, left Acutely occurring.  Initial injury occurred at work.  Appears more of a shoulder subluxation given improvement in initial injury. -Counseled on home exercise therapy and supportive care. -Could consider physical therapy or injection. -Provided work note

## 2022-09-21 NOTE — Patient Instructions (Signed)
Nice to meet you Please alternate heat and ice  Please try the exercises   Please send me a message in MyChart with any questions or updates.  Please see me back in 4 weeks.   --Dr. Raeford Razor

## 2022-09-21 NOTE — Assessment & Plan Note (Signed)
Acutely occurring.  Initial injury occurred at work.  Appears more of a shoulder subluxation given improvement in initial injury. -Counseled on home exercise therapy and supportive care. -Could consider physical therapy or injection. -Provided work note

## 2022-10-22 ENCOUNTER — Ambulatory Visit: Payer: 59 | Admitting: Family Medicine

## 2022-11-06 ENCOUNTER — Other Ambulatory Visit: Payer: Self-pay

## 2022-11-10 ENCOUNTER — Other Ambulatory Visit: Payer: Self-pay

## 2022-12-01 ENCOUNTER — Ambulatory Visit: Payer: 59 | Admitting: Family Medicine

## 2023-02-22 ENCOUNTER — Encounter: Payer: Self-pay | Admitting: *Deleted

## 2023-07-06 ENCOUNTER — Ambulatory Visit (INDEPENDENT_AMBULATORY_CARE_PROVIDER_SITE_OTHER): Payer: 59 | Admitting: Family Medicine

## 2023-07-06 ENCOUNTER — Encounter: Payer: Self-pay | Admitting: Family Medicine

## 2023-07-06 VITALS — BP 124/84 | HR 70 | Ht 72.0 in | Wt 227.0 lb

## 2023-07-06 DIAGNOSIS — Z0000 Encounter for general adult medical examination without abnormal findings: Secondary | ICD-10-CM

## 2023-07-06 DIAGNOSIS — E119 Type 2 diabetes mellitus without complications: Secondary | ICD-10-CM

## 2023-07-06 DIAGNOSIS — Z1159 Encounter for screening for other viral diseases: Secondary | ICD-10-CM | POA: Diagnosis not present

## 2023-07-06 DIAGNOSIS — R739 Hyperglycemia, unspecified: Secondary | ICD-10-CM | POA: Diagnosis not present

## 2023-07-06 DIAGNOSIS — Z114 Encounter for screening for human immunodeficiency virus [HIV]: Secondary | ICD-10-CM

## 2023-07-06 LAB — COMPREHENSIVE METABOLIC PANEL
ALT: 21 U/L (ref 0–53)
AST: 14 U/L (ref 0–37)
Albumin: 4.2 g/dL (ref 3.5–5.2)
Alkaline Phosphatase: 96 U/L (ref 39–117)
BUN: 18 mg/dL (ref 6–23)
CO2: 26 mEq/L (ref 19–32)
Calcium: 9.5 mg/dL (ref 8.4–10.5)
Chloride: 103 mEq/L (ref 96–112)
Creatinine, Ser: 1 mg/dL (ref 0.40–1.50)
GFR: 85.71 mL/min (ref >60.00)
Glucose, Bld: 207 mg/dL — ABNORMAL HIGH (ref 70–99)
Potassium: 4.3 mEq/L (ref 3.5–5.1)
Sodium: 139 mEq/L (ref 135–145)
Total Bilirubin: 0.7 mg/dL (ref 0.2–1.2)
Total Protein: 6.8 g/dL (ref 6.0–8.3)

## 2023-07-06 LAB — CBC WITH DIFFERENTIAL/PLATELET
Basophils Absolute: 0 10*3/uL (ref 0.0–0.1)
Basophils Relative: 0.5 % (ref 0.0–3.0)
Eosinophils Absolute: 0.1 10*3/uL (ref 0.0–0.7)
Eosinophils Relative: 1.6 % (ref 0.0–5.0)
HCT: 44.6 % (ref 39.0–52.0)
Hemoglobin: 14.9 g/dL (ref 13.0–17.0)
Lymphocytes Relative: 31.1 % (ref 12.0–46.0)
Lymphs Abs: 1.9 10*3/uL (ref 0.7–4.0)
MCHC: 33.4 g/dL (ref 30.0–36.0)
MCV: 89 fl (ref 78.0–100.0)
Monocytes Absolute: 0.4 10*3/uL (ref 0.1–1.0)
Monocytes Relative: 6.7 % (ref 3.0–12.0)
Neutro Abs: 3.6 10*3/uL (ref 1.4–7.7)
Neutrophils Relative %: 60.1 % (ref 43.0–77.0)
Platelets: 190 10*3/uL (ref 150.0–400.0)
RBC: 5.01 Mil/uL (ref 4.22–5.81)
RDW: 13.5 % (ref 11.5–15.5)
WBC: 6 10*3/uL (ref 4.0–10.5)

## 2023-07-06 LAB — TSH: TSH: 2.26 u[IU]/mL (ref 0.35–5.50)

## 2023-07-06 LAB — LIPID PANEL
Cholesterol: 158 mg/dL (ref 0–200)
HDL: 51.4 mg/dL (ref >39.00)
NonHDL: 106.2
Total CHOL/HDL Ratio: 3
Triglycerides: 241 mg/dL — ABNORMAL HIGH (ref 0.0–149.0)
VLDL: 48.2 mg/dL — ABNORMAL HIGH (ref 0.0–40.0)

## 2023-07-06 LAB — LDL CHOLESTEROL, DIRECT: Direct LDL: 100 mg/dL

## 2023-07-06 LAB — HEMOGLOBIN A1C: Hgb A1c MFr Bld: 10.1 % — ABNORMAL HIGH (ref 4.6–6.5)

## 2023-07-06 NOTE — Progress Notes (Signed)
Complete physical exam  Patient: Brian Pierce   DOB: [birth year]   52 y.o. Male  MRN: [id number]  Subjective:    Chief Complaint  Patient presents with   Annual Exam    Brian Pierce is a 52 y.o. male who presents today for a complete physical exam. He reports consuming a general and (trying to get smaller portions)  diet. The patient has a physically strenuous job, but has no regular exercise apart from work.  He generally feels well. He reports sleeping well. He does not have additional problems to discuss today.   Currently lives with: wife, teenage son and daughter  Acute concerns or interim problems since last visit: no  Vision concerns: no concerns Dental concerns: no concerns STD concerns: on concerns   Patient denies ETOH use. Patient endorses nicotine use (<1 pack/day) Patient denies illegal substance use.      Lab Results  Component Value Date   HGBA1C 6.4 08/31/2022      Most recent fall risk assessment:    07/06/2023    9:07 AM  Fall Risk   Falls in the past year? 0  Number falls in past yr: 0  Injury with Fall? 0  Risk for fall due to : No Fall Risks  Follow up Falls evaluation completed     Most recent depression screenings:    07/06/2023    9:07 AM 08/31/2022    7:13 AM  PHQ 2/9 Scores  PHQ - 2 Score 0 0  PHQ- 9 Score 0             Patient Care Team: Clayborne Dana, NP as PCP - General (Family Medicine)   Outpatient Medications Prior to Visit  Medication Sig   omeprazole (PRILOSEC) 40 MG capsule Take 1 capsule  by mouth daily before breakfast. (Patient taking differently: Take 40 mg by mouth daily as needed.)   [DISCONTINUED] meloxicam (MOBIC) 7.5 MG tablet Take 1 tablet (7.5 mg total) by mouth daily.   No facility-administered medications prior to visit.    ROS All review of systems negative except what is listed in the HPI        Objective:     BP 124/84   Pulse 70   Ht 6' (1.829 m)   Wt 227 lb (103 kg)    SpO2 97%   BMI 30.79 kg/m    Physical Exam Vitals reviewed.  Constitutional:      General: He is not in acute distress.    Appearance: Normal appearance. He is not ill-appearing.  HENT:     Head: Normocephalic and atraumatic.     Right Ear: Tympanic membrane normal.     Left Ear: Tympanic membrane normal.     Nose: Nose normal.     Mouth/Throat:     Mouth: Mucous membranes are moist.     Pharynx: Oropharynx is clear.  Eyes:     Extraocular Movements: Extraocular movements intact.     Conjunctiva/sclera: Conjunctivae normal.     Pupils: Pupils are equal, round, and reactive to light.  Neck:     Vascular: No carotid bruit.  Cardiovascular:     Rate and Rhythm: Normal rate and regular rhythm.     Pulses: Normal pulses.     Heart sounds: Normal heart sounds.  Pulmonary:     Effort: Pulmonary effort is normal.     Breath sounds: Normal breath sounds.  Abdominal:     General: Abdomen is flat. Bowel sounds are  normal. There is no distension.     Palpations: Abdomen is soft. There is no mass.     Tenderness: There is no abdominal tenderness. There is no right CVA tenderness, left CVA tenderness, guarding or rebound.  Genitourinary:    Comments: Deferred exam Musculoskeletal:        General: Normal range of motion.     Cervical back: Normal range of motion and neck supple. No tenderness.     Right lower leg: No edema.     Left lower leg: No edema.  Lymphadenopathy:     Cervical: No cervical adenopathy.  Skin:    General: Skin is warm and dry.     Capillary Refill: Capillary refill takes less than 2 seconds.  Neurological:     General: No focal deficit present.     Mental Status: He is alert and oriented to person, place, and time. Mental status is at baseline.  Psychiatric:        Mood and Affect: Mood normal.        Behavior: Behavior normal.        Thought Content: Thought content normal.        Judgment: Judgment normal.      No results found for any visits on  07/06/23.     Assessment & Plan:    Routine Health Maintenance and Physical Exam Discussed health promotion and safety including diet and exercise recommendations, dental health, and injury prevention. Tobacco cessation if applicable. Seat belts, sunscreen, smoke detectors, etc.    Immunization History  Administered Date(s) Administered   Influenza Whole 08/14/2008   Tdap 03/03/2018    Health Maintenance  Topic Date Due   FOOT EXAM  Never done   OPHTHALMOLOGY EXAM  Never done   HIV Screening  Never done   Hepatitis C Screening  Never done   Zoster Vaccines- Shingrix (1 of 2) Never done   HEMOGLOBIN A1C  03/02/2023   INFLUENZA VACCINE  06/10/2023   COVID-19 Vaccine (1) 07/05/2024 (Originally 08/24/1976)   Diabetic kidney evaluation - eGFR measurement  09/01/2023   Diabetic kidney evaluation - Urine ACR  09/01/2023   DTaP/Tdap/Td (2 - Td or Tdap) 03/03/2028   Colonoscopy  09/10/2030   HPV VACCINES  Aged Out        Problem List Items Addressed This Visit   None Visit Diagnoses     Annual physical exam    -  Primary   Relevant Orders   Comprehensive metabolic panel   Hemoglobin A1c   Lipid panel   CBC with Differential/Platelet   TSH   HIV Antibody (routine testing w rflx)   Hepatitis C antibody   Hyperglycemia       Relevant Orders   Comprehensive metabolic panel   Hemoglobin A1c   Encounter for screening for HIV       Relevant Orders   HIV Antibody (routine testing w rflx)   Encounter for hepatitis C screening test for low risk patient       Relevant Orders   Hepatitis C antibody      Return in about 1 year (around 07/05/2024) for physical.     Clayborne Dana, NP

## 2023-07-07 ENCOUNTER — Other Ambulatory Visit: Payer: Self-pay | Admitting: Family Medicine

## 2023-07-07 DIAGNOSIS — E119 Type 2 diabetes mellitus without complications: Secondary | ICD-10-CM

## 2023-07-07 LAB — HIV ANTIBODY (ROUTINE TESTING W REFLEX): HIV 1&2 Ab, 4th Generation: NONREACTIVE

## 2023-07-07 LAB — HEPATITIS C ANTIBODY: Hepatitis C Ab: NONREACTIVE

## 2023-07-07 MED ORDER — METFORMIN HCL 1000 MG PO TABS: ORAL_TABLET | ORAL | 2 refills | Status: DC

## 2023-07-07 NOTE — Progress Notes (Signed)
Your A1c has jumped up. We need to start diabetes medication. Let's restart the metformin. We likely need a second medication as well based on how high the A1c is - if you would be interested in a GLP1 like Ozempic, it has been shown to also have cardiac benefits.  I'll go ahead and refill your previous metformin for now - start with just once a day dosing for 1-2 weeks and if adjusting well, go up to the twice daily dosing. Let me know what you think about adding another medicine - we can always have an appointment to discuss if you'd like.   Schedule a  82-month follow-up.

## 2023-07-08 ENCOUNTER — Encounter: Payer: Self-pay | Admitting: Family Medicine

## 2023-07-13 ENCOUNTER — Ambulatory Visit: Payer: 59 | Admitting: Family Medicine

## 2023-07-13 ENCOUNTER — Ambulatory Visit (INDEPENDENT_AMBULATORY_CARE_PROVIDER_SITE_OTHER): Payer: 59 | Admitting: Family Medicine

## 2023-07-13 ENCOUNTER — Encounter: Payer: Self-pay | Admitting: Family Medicine

## 2023-07-13 VITALS — BP 132/83 | HR 71 | Temp 98.0°F | Resp 16 | Ht 72.0 in | Wt 228.0 lb

## 2023-07-13 DIAGNOSIS — E119 Type 2 diabetes mellitus without complications: Secondary | ICD-10-CM

## 2023-07-13 DIAGNOSIS — Z7985 Long-term (current) use of injectable non-insulin antidiabetic drugs: Secondary | ICD-10-CM | POA: Diagnosis not present

## 2023-07-13 DIAGNOSIS — F17200 Nicotine dependence, unspecified, uncomplicated: Secondary | ICD-10-CM | POA: Diagnosis not present

## 2023-07-13 MED ORDER — NICOTINE 7 MG/24HR TD PT24: 7.0000 mg | MEDICATED_PATCH | Freq: Every day | TRANSDERMAL | 0 refills | Status: DC

## 2023-07-13 MED ORDER — NICOTINE 14 MG/24HR TD PT24: 14.0000 mg | MEDICATED_PATCH | Freq: Every day | TRANSDERMAL | 2 refills | Status: DC

## 2023-07-13 MED ORDER — OZEMPIC (0.25 OR 0.5 MG/DOSE) 2 MG/3ML ~~LOC~~ SOPN
0.2500 mg | PEN_INJECTOR | SUBCUTANEOUS | 1 refills | Status: DC
  Filled 2023-08-23: qty 3, 28d supply, fill #0

## 2023-07-13 NOTE — Patient Instructions (Signed)
Adding Ozempic If tolerating well after 4 weeks, let me know so we can increase the dose Focus on smaller portions, low-carb diet, regular exercise, adequate hydration, and fiber intake   Adding nicotine patches Try 6 weeks of 14 mg patch, then 2-3 weeks of 7 mg patch

## 2023-07-13 NOTE — Progress Notes (Signed)
Established Patient Office Visit  Subjective   Patient ID: Brian Pierce, male    DOB: 1971-01-24  Age: 52 y.o. MRN: [id number]  Chief Complaint  Patient presents with   Medical Management of Chronic Issues    HPI  Discussed the use of AI scribe software for clinical note transcription with the patient, who gave verbal consent to proceed.  History of Present Illness   The patient recently had A1c of 10.1% at visit here last week. He came in to discuss treatment options. The patient has been on metformin, but it has been causing significant gastrointestinal distress, including frequent bowel movements, which is particularly problematic due to their job involving extensive travel.  They have heard of Ozempic and are open to trying it. They deny any history of pancreatitis or personal/family history of medullary thyroid cancers or multiple endocrine neoplasia.   In addition to their diabetes, the patient is a light smoker, consuming approximately six to eight cigarettes per day during the work week and two to three cigarettes per day on the weekends. He is wanting some help to quit smoking.   They deny any symptoms of high or low blood sugar.        Lab Results  Component Value Date   HGBA1C 10.1 (H) 07/06/2023       ROS All review of systems negative except what is listed in the HPI    Objective:     BP 132/83   Pulse 71   Temp 98 F (36.7 C) (Oral)   Resp 16   Ht 6' (1.829 m)   Wt 228 lb (103.4 kg)   SpO2 98%   BMI 30.92 kg/m    Physical Exam Vitals reviewed.  Constitutional:      Appearance: Normal appearance.  Cardiovascular:     Rate and Rhythm: Normal rate and regular rhythm.     Heart sounds: Normal heart sounds.  Pulmonary:     Effort: Pulmonary effort is normal.     Breath sounds: Normal breath sounds.  Skin:    General: Skin is warm and dry.  Neurological:     Mental Status: He is alert and oriented to person, place, and time.   Psychiatric:        Mood and Affect: Mood normal.        Behavior: Behavior normal.        Thought Content: Thought content normal.        Judgment: Judgment normal.      No results found for any visits on 07/13/23.    The ASCVD Risk score (Arnett DK, et al., 2019) failed to calculate for the following reasons:   The patient has a prior MI or stroke diagnosis    Assessment & Plan:   Problem List Items Addressed This Visit       Active Problems   Type 2 diabetes mellitus without complication, without long-term current use of insulin ([facility]) - Primary (Chronic)    Type 2 Diabetes Mellitus Elevated HbA1c at 10.1%. Metformin intolerance reported. Discussed the benefits and potential side effects of GLP-1 agonists, specifically Ozempic.  -Discontinue Metformin due to intolerance. -Start Ozempic, monitor for side effects  -Check HbA1c in 3 months. -Advise patient to monitor fasting blood sugars daily       Relevant Medications   Semaglutide,0.25 or 0.5MG /DOS, (OZEMPIC, 0.25 OR 0.5 MG/DOSE,) 2 MG/3ML SOPN   Tobacco dependence    Light smoker, interested in smoking cessation. Discussed options including nicotine patches  and Wellbutrin. -Start nicotine patches at a lower dose due to lighter smoking status. -Consider Wellbutrin for smoking cessation if not making good progress with patches.      Relevant Medications   nicotine (NICODERM CQ - DOSED IN MG/24 HOURS) 14 mg/24hr patch   nicotine (NICODERM CQ - DOSED IN MG/24 HR) 7 mg/24hr patch    Return in about 3 months (around 10/12/2023) for routine follow-up - DM2.    Clayborne Dana, NP

## 2023-07-13 NOTE — Assessment & Plan Note (Signed)
Light smoker, interested in smoking cessation. Discussed options including nicotine patches and Wellbutrin. -Start nicotine patches at a lower dose due to lighter smoking status. -Consider Wellbutrin for smoking cessation if not making good progress with patches.

## 2023-07-13 NOTE — Assessment & Plan Note (Signed)
Type 2 Diabetes Mellitus Elevated HbA1c at 10.1%. Metformin intolerance reported. Discussed the benefits and potential side effects of GLP-1 agonists, specifically Ozempic.  -Discontinue Metformin due to intolerance. -Start Ozempic, monitor for side effects  -Check HbA1c in 3 months. -Advise patient to monitor fasting blood sugars daily

## 2023-08-23 ENCOUNTER — Other Ambulatory Visit: Payer: Self-pay

## 2023-08-23 ENCOUNTER — Other Ambulatory Visit (HOSPITAL_COMMUNITY): Payer: Self-pay

## 2023-08-23 MED ORDER — OZEMPIC (0.25 OR 0.5 MG/DOSE) 2 MG/3ML ~~LOC~~ SOPN
0.2500 mg | PEN_INJECTOR | SUBCUTANEOUS | 1 refills | Status: DC
  Filled 2023-08-23 – 2023-09-20 (×2): qty 3, 56d supply, fill #0

## 2023-08-24 ENCOUNTER — Encounter: Payer: Self-pay | Admitting: Pharmacist

## 2023-08-24 ENCOUNTER — Other Ambulatory Visit: Payer: Self-pay

## 2023-08-25 ENCOUNTER — Other Ambulatory Visit (HOSPITAL_COMMUNITY): Payer: Self-pay

## 2023-08-26 ENCOUNTER — Other Ambulatory Visit: Payer: Self-pay

## 2023-08-26 ENCOUNTER — Other Ambulatory Visit (HOSPITAL_COMMUNITY): Payer: Self-pay

## 2023-09-15 ENCOUNTER — Other Ambulatory Visit: Payer: Self-pay

## 2023-09-15 ENCOUNTER — Encounter: Payer: Self-pay | Admitting: Gastroenterology

## 2023-09-15 ENCOUNTER — Other Ambulatory Visit (HOSPITAL_COMMUNITY): Payer: Self-pay

## 2023-09-20 ENCOUNTER — Other Ambulatory Visit: Payer: Self-pay

## 2023-09-20 ENCOUNTER — Other Ambulatory Visit (HOSPITAL_COMMUNITY): Payer: Self-pay

## 2023-09-21 ENCOUNTER — Encounter: Payer: Self-pay | Admitting: Family Medicine

## 2023-09-21 ENCOUNTER — Ambulatory Visit: Payer: 59 | Admitting: Family Medicine

## 2023-09-21 VITALS — BP 118/76 | HR 76 | Temp 97.6°F | Ht 72.0 in | Wt 228.0 lb

## 2023-09-21 DIAGNOSIS — J069 Acute upper respiratory infection, unspecified: Secondary | ICD-10-CM

## 2023-09-21 DIAGNOSIS — E119 Type 2 diabetes mellitus without complications: Secondary | ICD-10-CM | POA: Diagnosis not present

## 2023-09-21 DIAGNOSIS — Z7985 Long-term (current) use of injectable non-insulin antidiabetic drugs: Secondary | ICD-10-CM | POA: Diagnosis not present

## 2023-09-21 MED ORDER — PROMETHAZINE-DM 6.25-15 MG/5ML PO SYRP: 5.0000 mL | ORAL_SOLUTION | Freq: Four times a day (QID) | ORAL | 0 refills | Status: DC | PRN

## 2023-09-21 MED ORDER — ALBUTEROL SULFATE HFA 108 (90 BASE) MCG/ACT IN AERS: 2.0000 | INHALATION_SPRAY | Freq: Four times a day (QID) | RESPIRATORY_TRACT | 0 refills | Status: AC | PRN

## 2023-09-21 NOTE — Assessment & Plan Note (Signed)
Patient tolerating Ozempic 0.25mg  weekly well. No longer taking Metformin due to gastrointestinal side effects. -Increase Ozempic to 0.5mg  weekly. -Continue lifestyle modifications. -Plan to check A1c at next appointment in one month.

## 2023-09-21 NOTE — Patient Instructions (Signed)
Likely Viral Upper Respiratory Infection  Continue supportive measures including rest, hydration, humidifier use, steam showers, warm compresses to sinuses, warm liquids with lemon and honey, and over-the-counter cough, cold, and analgesics as needed. If symptoms persist 8-10 days, become severe, or return after a few days of feeling better, then please follow-up for repeat evaluation to determine if antibiotics may be necessary. Adding cough medicine and albuterol inhaler for cough, wheezing, shortness of breath   Over the counter medications that may be helpful for symptoms:  Guaifenesin 1200 mg extended release tabs twice daily, with plenty of water For cough and congestion Brand name: Mucinex   Pseudoephedrine 30 mg, one or two tabs every 4 to 6 hours For sinus congestion Brand name: Sudafed You must get this from the pharmacy counter.  Oxymetazoline nasal spray each morning, one spray in each nostril, for NO MORE THAN 3 days  For nasal and sinus congestion Brand name: Afrin Saline nasal spray or Saline Nasal Irrigation (Netti Pot, etc) 3-5 times a day For nasal and sinus congestion Brand names: Ocean or AYR Fluticasone nasal spray OR Mometasone nasal spray OR Triamcinolone Acetonide nasal spray - follow directions on the packaging For nasal and sinus congestion Brand name: Flonase, Nasonex, Nasacort Warm salt water gargles  For sore throat Every few hours as needed Alternate ibuprofen 400-600 mg and acetaminophen 1000 mg every 6 hours For fever, body aches, headache Brand names: Motrin or Advil and Tylenol Dextromethorphan 12-hour cough version 30 mg every 12 hours  For cough Brand name: Delsym Stop all other cold medications for now (Nyquil, Dayquil, Tylenol Cold, Theraflu, etc) and other non-prescription cough/cold preparations. Many of these have the same ingredients listed above and could cause an overdose of medication.   Herbal treatments that have been shown to be  helpful in some patients include: Vitamin C 1000 mg per day Zinc 100 mg per day Quercetin 25-500 mg twice a day Melatonin 5-10mg  at bedtime Honey Green Tea  General Instructions Allow your body to rest Drink PLENTY of fluids Typically, we are the most contagious 1-2 days before symptoms start through the first 2-3 days of most severe symptoms. Per CDC guidelines, you can return to school/work when symptoms have started to improve and you have been fever-free for 24 hours. However, recommend you continue extra precautions for the following 5 days (frequent hand hygiene, masking, covering coughs/sneezes, minimize exposure to immunocompromised individuals, etc).  If you develop severe shortness of breath, uncontrolled fevers, coughing up blood, confusion, chest pain, or signs of dehydration (such as significantly decreased urine amounts or dizziness with standing) please go to the nearest ER.

## 2023-09-21 NOTE — Progress Notes (Signed)
Acute Office Visit  Subjective:     Patient ID: Brian Pierce, male    DOB: 10-Sep-1971, 52 y.o.   MRN: [id number]  Chief Complaint  Patient presents with   Cough     Patient is in today for URI, cough.  Discussed the use of AI scribe software for clinical note transcription with the patient, who gave verbal consent to proceed.  History of Present Illness   The patient, with a history of diabetes managed with Ozempic, presented with symptoms that started over the weekend. He reported experiencing chest congestion, coughing, and body aches, but denied having a fever. The patient also noted a runny nose and significant coughing throughout the night, which disrupted sleep. He denied being in contact with any known sick individuals.  The patient described sputum as yellowish-brown and thick, which he found concerning. However, he noted that the color and consistency of the sputum had improved slightly over time. Despite feeling low on energy, the patient reported feeling better overall and believed he was improving.  The patient also reported experiencing wheezing, particularly when lying down. He had been managing symptoms with over-the-counter cough and cold medicine. He denied any hemoptysis. The patient also reported experiencing night sweats, but denied feeling feverish during the day.  In addition to respiratory symptoms, the patient mentioned an increase in Ozempic dosage for  diabetes management. She reported tolerating the medication well and had stopped taking metformin due to gastrointestinal side effects. The patient had been on a 0.25 dose of Ozempic for approximately five to six weeks and was planning to increase the dose to 0.5 once a week.            All review of systems negative except what is listed in the HPI      Objective:    BP 118/76   Pulse 76   Temp 97.6 F (36.4 C) (Oral)   Ht 6' (1.829 m)   Wt 228 lb (103.4 kg)   SpO2 97%   BMI 30.92 kg/m     Physical Exam Vitals reviewed.  Constitutional:      Appearance: Normal appearance.  HENT:     Head: Normocephalic and atraumatic.     Right Ear: Tympanic membrane normal.     Left Ear: Tympanic membrane normal.     Nose: Nose normal.     Mouth/Throat:     Mouth: Mucous membranes are moist.     Pharynx: Oropharynx is clear. No oropharyngeal exudate or posterior oropharyngeal erythema.  Eyes:     Conjunctiva/sclera: Conjunctivae normal.  Cardiovascular:     Rate and Rhythm: Normal rate and regular rhythm.     Heart sounds: Normal heart sounds.  Pulmonary:     Effort: Pulmonary effort is normal.     Breath sounds: Normal breath sounds.  Musculoskeletal:     Cervical back: Normal range of motion.  Lymphadenopathy:     Cervical: No cervical adenopathy.  Skin:    General: Skin is warm and dry.  Neurological:     Mental Status: He is alert and oriented to person, place, and time.  Psychiatric:        Mood and Affect: Mood normal.        Behavior: Behavior normal.        Thought Content: Thought content normal.        Judgment: Judgment normal.        No results found for any visits on 09/21/23.  Assessment & Plan:   Problem List Items Addressed This Visit       Active Problems   Type 2 diabetes mellitus without complication, without long-term current use of insulin (HCC) (Chronic)    Patient tolerating Ozempic 0.25mg  weekly well. No longer taking Metformin due to gastrointestinal side effects. -Increase Ozempic to 0.5mg  weekly. -Continue lifestyle modifications. -Plan to check A1c at next appointment in one month.       Other Visit Diagnoses     Viral URI with cough    -  Primary   Relevant Medications   promethazine-dextromethorphan (PROMETHAZINE-DM) 6.25-15 MG/5ML syrup   albuterol (VENTOLIN HFA) 108 (90 Base) MCG/ACT inhaler     Likely Viral Upper Respiratory Infection  Continue supportive measures including rest, hydration, humidifier use,  steam showers, warm compresses to sinuses, warm liquids with lemon and honey, and over-the-counter cough, cold, and analgesics as needed. If symptoms persist 8-10 days, become severe, or return after a few days of feeling better, then please follow-up for repeat evaluation to determine if antibiotics may be necessary. Adding cough medicine and albuterol inhaler for cough, wheezing, shortness of breath           Meds ordered this encounter  Medications   promethazine-dextromethorphan (PROMETHAZINE-DM) 6.25-15 MG/5ML syrup    Sig: Take 5 mLs by mouth 4 (four) times daily as needed for cough.    Dispense:  118 mL    Refill:  0    Order Specific Question:   Supervising Provider    Answer:   Danise Edge A [[id number]]   albuterol (VENTOLIN HFA) 108 (90 Base) MCG/ACT inhaler    Sig: Inhale 2 puffs into the lungs every 6 (six) hours as needed for wheezing or shortness of breath.    Dispense:  8 g    Refill:  0    Order Specific Question:   Supervising Provider    Answer:   Danise Edge A [[id number]]    Return if symptoms worsen or fail to improve.  Clayborne Dana, NP

## 2023-09-23 ENCOUNTER — Other Ambulatory Visit: Payer: Self-pay

## 2023-09-23 ENCOUNTER — Encounter: Payer: Self-pay | Admitting: Family Medicine

## 2023-09-23 DIAGNOSIS — E119 Type 2 diabetes mellitus without complications: Secondary | ICD-10-CM

## 2023-09-23 MED ORDER — OZEMPIC (0.25 OR 0.5 MG/DOSE) 2 MG/3ML ~~LOC~~ SOPN
0.5000 mg | PEN_INJECTOR | SUBCUTANEOUS | 1 refills | Status: DC
  Filled 2023-09-23: qty 3, 28d supply, fill #0
  Filled 2023-10-20: qty 3, 28d supply, fill #1

## 2023-10-12 ENCOUNTER — Ambulatory Visit: Payer: 59 | Admitting: Family Medicine

## 2023-10-12 ENCOUNTER — Other Ambulatory Visit (HOSPITAL_COMMUNITY): Payer: Self-pay

## 2023-10-20 ENCOUNTER — Other Ambulatory Visit (HOSPITAL_COMMUNITY): Payer: Self-pay

## 2023-10-20 ENCOUNTER — Other Ambulatory Visit: Payer: Self-pay

## 2023-11-20 ENCOUNTER — Other Ambulatory Visit: Payer: Self-pay | Admitting: Family Medicine

## 2023-11-20 DIAGNOSIS — E119 Type 2 diabetes mellitus without complications: Secondary | ICD-10-CM

## 2023-11-22 ENCOUNTER — Other Ambulatory Visit: Payer: Self-pay

## 2023-11-22 MED ORDER — OZEMPIC (0.25 OR 0.5 MG/DOSE) 2 MG/3ML ~~LOC~~ SOPN
0.5000 mg | PEN_INJECTOR | SUBCUTANEOUS | 3 refills | Status: DC
  Filled 2023-11-22 – 2023-11-29 (×2): qty 3, 28d supply, fill #0
  Filled 2023-12-24: qty 3, 28d supply, fill #1
  Filled 2024-01-19: qty 3, 28d supply, fill #2
  Filled 2024-02-21: qty 3, 28d supply, fill #3

## 2023-11-23 ENCOUNTER — Other Ambulatory Visit: Payer: Self-pay

## 2023-11-26 ENCOUNTER — Other Ambulatory Visit: Payer: Self-pay

## 2023-11-29 ENCOUNTER — Other Ambulatory Visit: Payer: Self-pay

## 2023-11-30 ENCOUNTER — Other Ambulatory Visit: Payer: Self-pay

## 2023-12-24 ENCOUNTER — Other Ambulatory Visit (HOSPITAL_COMMUNITY): Payer: Self-pay

## 2024-01-19 ENCOUNTER — Other Ambulatory Visit (HOSPITAL_COMMUNITY): Payer: Self-pay

## 2024-02-10 ENCOUNTER — Other Ambulatory Visit (HOSPITAL_COMMUNITY): Payer: Self-pay

## 2024-02-10 ENCOUNTER — Other Ambulatory Visit: Payer: Self-pay

## 2024-02-10 ENCOUNTER — Other Ambulatory Visit: Payer: Self-pay | Admitting: Family Medicine

## 2024-02-10 MED ORDER — OMEPRAZOLE 40 MG PO CPDR
40.0000 mg | DELAYED_RELEASE_CAPSULE | Freq: Every day | ORAL | 1 refills | Status: AC
  Filled 2024-02-10: qty 90, 90d supply, fill #0

## 2024-02-15 ENCOUNTER — Other Ambulatory Visit (HOSPITAL_COMMUNITY): Payer: Self-pay

## 2024-02-21 ENCOUNTER — Other Ambulatory Visit: Payer: Self-pay

## 2024-03-23 ENCOUNTER — Other Ambulatory Visit: Payer: Self-pay

## 2024-03-23 ENCOUNTER — Other Ambulatory Visit (HOSPITAL_COMMUNITY): Payer: Self-pay

## 2024-03-23 ENCOUNTER — Other Ambulatory Visit: Payer: Self-pay | Admitting: Family Medicine

## 2024-03-23 DIAGNOSIS — E119 Type 2 diabetes mellitus without complications: Secondary | ICD-10-CM

## 2024-03-23 MED ORDER — OZEMPIC (0.25 OR 0.5 MG/DOSE) 2 MG/3ML ~~LOC~~ SOPN
0.5000 mg | PEN_INJECTOR | SUBCUTANEOUS | 0 refills | Status: DC
  Filled 2024-03-23: qty 3, 28d supply, fill #0

## 2024-04-20 ENCOUNTER — Other Ambulatory Visit: Payer: Self-pay | Admitting: Family Medicine

## 2024-04-20 ENCOUNTER — Encounter: Payer: Self-pay | Admitting: Family Medicine

## 2024-04-20 ENCOUNTER — Other Ambulatory Visit: Payer: Self-pay | Admitting: Family

## 2024-04-20 DIAGNOSIS — E119 Type 2 diabetes mellitus without complications: Secondary | ICD-10-CM

## 2024-04-24 ENCOUNTER — Other Ambulatory Visit: Payer: Self-pay | Admitting: Family Medicine

## 2024-04-24 ENCOUNTER — Other Ambulatory Visit: Payer: Self-pay

## 2024-04-24 DIAGNOSIS — E119 Type 2 diabetes mellitus without complications: Secondary | ICD-10-CM

## 2024-04-24 MED ORDER — FREESTYLE LITE TEST VI STRP
ORAL_STRIP | 2 refills | Status: AC
Start: 2024-04-24 — End: ?
  Filled 2024-04-24: qty 100, 33d supply, fill #0

## 2024-04-27 ENCOUNTER — Other Ambulatory Visit (HOSPITAL_COMMUNITY): Payer: Self-pay

## 2024-04-27 ENCOUNTER — Encounter: Payer: Self-pay | Admitting: Family Medicine

## 2024-04-27 ENCOUNTER — Other Ambulatory Visit: Payer: Self-pay | Admitting: Family Medicine

## 2024-04-27 DIAGNOSIS — E119 Type 2 diabetes mellitus without complications: Secondary | ICD-10-CM

## 2024-05-01 MED ORDER — OZEMPIC (0.25 OR 0.5 MG/DOSE) 2 MG/3ML ~~LOC~~ SOPN
0.5000 mg | PEN_INJECTOR | SUBCUTANEOUS | 1 refills | Status: DC
  Filled 2024-05-08: qty 3, 28d supply, fill #0
  Filled 2024-05-26 – 2024-05-31 (×2): qty 3, 28d supply, fill #1

## 2024-05-02 ENCOUNTER — Telehealth: Payer: Self-pay

## 2024-05-02 ENCOUNTER — Other Ambulatory Visit (HOSPITAL_COMMUNITY): Payer: Self-pay

## 2024-05-02 NOTE — Telephone Encounter (Signed)
 Pharmacy Patient Advocate Encounter   Received notification from CoverMyMeds that prior authorization for ozempic  is required/requested.   Insurance verification completed.   The patient is insured through [facility] .   Per test claim: PA required; PA submitted to above mentioned insurance via CoverMyMeds Key/confirmation #/EOC [id number] Status is pending

## 2024-05-04 ENCOUNTER — Other Ambulatory Visit (HOSPITAL_COMMUNITY): Payer: Self-pay

## 2024-05-04 NOTE — Telephone Encounter (Signed)
 Pharmacy Patient Advocate Encounter  Received notification from [employer] that Prior Authorization for Ozempic  2 has been approved. Patient co-pay is $24.99 for a 28 day supply.   PA #/Case ID/Reference #: [id number]

## 2024-05-08 ENCOUNTER — Other Ambulatory Visit: Payer: Self-pay

## 2024-05-08 ENCOUNTER — Other Ambulatory Visit (HOSPITAL_COMMUNITY): Payer: Self-pay

## 2024-05-26 ENCOUNTER — Other Ambulatory Visit (HOSPITAL_COMMUNITY): Payer: Self-pay

## 2024-06-22 ENCOUNTER — Other Ambulatory Visit: Payer: Self-pay | Admitting: Family Medicine

## 2024-06-22 ENCOUNTER — Other Ambulatory Visit (HOSPITAL_COMMUNITY): Payer: Self-pay

## 2024-06-22 ENCOUNTER — Other Ambulatory Visit: Payer: Self-pay

## 2024-06-22 DIAGNOSIS — E119 Type 2 diabetes mellitus without complications: Secondary | ICD-10-CM

## 2024-06-22 MED ORDER — OZEMPIC (0.25 OR 0.5 MG/DOSE) 2 MG/3ML ~~LOC~~ SOPN
0.5000 mg | PEN_INJECTOR | SUBCUTANEOUS | 1 refills | Status: DC
  Filled 2024-06-22 – 2024-06-23 (×2): qty 3, 28d supply, fill #0

## 2024-06-23 ENCOUNTER — Other Ambulatory Visit: Payer: Self-pay

## 2024-06-23 ENCOUNTER — Other Ambulatory Visit (HOSPITAL_COMMUNITY): Payer: Self-pay

## 2024-07-05 DIAGNOSIS — G9332 Myalgic encephalomyelitis/chronic fatigue syndrome: Secondary | ICD-10-CM | POA: Insufficient documentation

## 2024-07-05 DIAGNOSIS — Z8709 Personal history of other diseases of the respiratory system: Secondary | ICD-10-CM | POA: Insufficient documentation

## 2024-07-05 DIAGNOSIS — M1990 Unspecified osteoarthritis, unspecified site: Secondary | ICD-10-CM | POA: Insufficient documentation

## 2024-07-05 DIAGNOSIS — E669 Obesity, unspecified: Secondary | ICD-10-CM | POA: Insufficient documentation

## 2024-07-05 DIAGNOSIS — E782 Mixed hyperlipidemia: Secondary | ICD-10-CM | POA: Insufficient documentation

## 2024-07-06 ENCOUNTER — Encounter: Payer: Self-pay | Admitting: Family Medicine

## 2024-07-06 ENCOUNTER — Ambulatory Visit: Payer: Self-pay | Admitting: Family Medicine

## 2024-07-06 ENCOUNTER — Other Ambulatory Visit (HOSPITAL_COMMUNITY): Payer: Self-pay

## 2024-07-06 ENCOUNTER — Ambulatory Visit: Admitting: Family Medicine

## 2024-07-06 VITALS — BP 127/75 | HR 77 | Ht 72.0 in | Wt 223.0 lb

## 2024-07-06 DIAGNOSIS — Z7985 Long-term (current) use of injectable non-insulin antidiabetic drugs: Secondary | ICD-10-CM

## 2024-07-06 DIAGNOSIS — Z0000 Encounter for general adult medical examination without abnormal findings: Secondary | ICD-10-CM

## 2024-07-06 DIAGNOSIS — E782 Mixed hyperlipidemia: Secondary | ICD-10-CM

## 2024-07-06 DIAGNOSIS — E119 Type 2 diabetes mellitus without complications: Secondary | ICD-10-CM | POA: Diagnosis not present

## 2024-07-06 LAB — COMPREHENSIVE METABOLIC PANEL WITH GFR
ALT: 48 U/L (ref 0–53)
AST: 28 U/L (ref 0–37)
Albumin: 4.5 g/dL (ref 3.5–5.2)
Alkaline Phosphatase: 101 U/L (ref 39–117)
BUN: 22 mg/dL (ref 6–23)
CO2: 26 meq/L (ref 19–32)
Calcium: 9.3 mg/dL (ref 8.4–10.5)
Chloride: 103 meq/L (ref 96–112)
Creatinine, Ser: 1.1 mg/dL (ref 0.40–1.50)
GFR: 75.91 mL/min (ref >60.00)
Glucose, Bld: 159 mg/dL — ABNORMAL HIGH (ref 70–99)
Potassium: 4.6 meq/L (ref 3.5–5.1)
Sodium: 139 meq/L (ref 135–145)
Total Bilirubin: 0.5 mg/dL (ref 0.2–1.2)
Total Protein: 7.1 g/dL (ref 6.0–8.3)

## 2024-07-06 LAB — LIPID PANEL
Cholesterol: 164 mg/dL (ref 0–200)
HDL: 64.1 mg/dL (ref >39.00)
LDL Cholesterol: 69 mg/dL (ref 0–99)
NonHDL: 99.9
Total CHOL/HDL Ratio: 3
Triglycerides: 155 mg/dL — ABNORMAL HIGH (ref 0.0–149.0)
VLDL: 31 mg/dL (ref 0.0–40.0)

## 2024-07-06 LAB — MICROALBUMIN / CREATININE URINE RATIO
Creatinine,U: 171.6 mg/dL
Microalb Creat Ratio: 14.5 mg/g (ref 0.0–30.0)
Microalb, Ur: 2.5 mg/dL — ABNORMAL HIGH (ref 0.0–1.9)

## 2024-07-06 LAB — CBC WITH DIFFERENTIAL/PLATELET
Basophils Absolute: 0 K/uL (ref 0.0–0.1)
Basophils Relative: 0.8 % (ref 0.0–3.0)
Eosinophils Absolute: 0.2 K/uL (ref 0.0–0.7)
Eosinophils Relative: 2.4 % (ref 0.0–5.0)
HCT: 48.2 % (ref 39.0–52.0)
Hemoglobin: 16.3 g/dL (ref 13.0–17.0)
Lymphocytes Relative: 30.1 % (ref 12.0–46.0)
Lymphs Abs: 2 K/uL (ref 0.7–4.0)
MCHC: 33.9 g/dL (ref 30.0–36.0)
MCV: 90.3 fl (ref 78.0–100.0)
Monocytes Absolute: 0.5 K/uL (ref 0.1–1.0)
Monocytes Relative: 8.3 % (ref 3.0–12.0)
Neutro Abs: 3.8 K/uL (ref 1.4–7.7)
Neutrophils Relative %: 58.4 % (ref 43.0–77.0)
Platelets: 183 K/uL (ref 150.0–400.0)
RBC: 5.34 Mil/uL (ref 4.22–5.81)
RDW: 13.4 % (ref 11.5–15.5)
WBC: 6.5 K/uL (ref 4.0–10.5)

## 2024-07-06 LAB — TSH: TSH: 1.45 u[IU]/mL (ref 0.35–5.50)

## 2024-07-06 LAB — HEMOGLOBIN A1C: Hgb A1c MFr Bld: 6.8 % — ABNORMAL HIGH (ref 4.6–6.5)

## 2024-07-06 MED ORDER — OZEMPIC (0.25 OR 0.5 MG/DOSE) 2 MG/3ML ~~LOC~~ SOPN
0.5000 mg | PEN_INJECTOR | SUBCUTANEOUS | 1 refills | Status: DC
  Filled 2024-07-06 – 2024-07-25 (×2): qty 3, 28d supply, fill #0
  Filled 2024-08-23: qty 3, 28d supply, fill #1

## 2024-07-06 NOTE — Progress Notes (Signed)
 Complete physical exam  Patient: Brian Pierce   DOB: Mar 22, 1971   53 y.o. Male  MRN: [id number]  Subjective:    Chief Complaint  Patient presents with   Annual Exam    Brian Pierce is a 53 y.o. male who presents today for a complete physical exam. He reports consuming a general and (trying to get smaller portions) diet. He has been working out regularly with his son - weights, running, basketball.  He generally feels well. He reports sleeping well. He does not have additional problems to discuss today.   Currently lives with: wife, teenage son - daughter at [facility] Acute concerns or interim problems since last visit: no  Vision concerns: no concerns Dental concerns: no concerns STD concerns: on concerns   Patient denies ETOH use. Patient endorses nicotine  use, former Patient denies illegal substance use.     Lab Results  Component Value Date   HGBA1C 10.1 (H) 07/06/2023    Wt Readings from Last 3 Encounters:  07/06/24 223 lb (101.2 kg)  09/21/23 228 lb (103.4 kg)  07/13/23 228 lb (103.4 kg)     Most recent fall risk assessment:    07/06/2024    8:24 AM  Fall Risk   Falls in the past year? 0  Number falls in past yr: 0  Injury with Fall? 0  Risk for fall due to : No Fall Risks  Follow up Falls evaluation completed     Most recent depression screenings:    07/06/2024    8:25 AM 07/06/2023    9:07 AM  PHQ 2/9 Scores  PHQ - 2 Score 0 0  PHQ- 9 Score 0 0            Patient Care Team: Almarie Waddell NOVAK, NP as PCP - General (Family Medicine)   Outpatient Medications Prior to Visit  Medication Sig   albuterol  (VENTOLIN  HFA) 108 (90 Base) MCG/ACT inhaler Inhale 2 puffs into the lungs every 6 (six) hours as needed for wheezing or shortness of breath.   glucose blood (FREESTYLE LITE) test strip Use to test blood glucose 1-3 times daily as needed   omeprazole  (PRILOSEC) 40 MG capsule Take 1 capsule  by mouth daily before breakfast.   [DISCONTINUED]  Semaglutide ,0.25 or 0.5MG /DOS, (OZEMPIC , 0.25 OR 0.5 MG/DOSE,) 2 MG/3ML SOPN Inject 0.5 mg into the skin once a week. Due for appt   [DISCONTINUED] promethazine -dextromethorphan (PROMETHAZINE -DM) 6.25-15 MG/5ML syrup Take 5 mLs by mouth 4 (four) times daily as needed for cough.   No facility-administered medications prior to visit.    ROS All review of systems negative except what is listed in the HPI        Objective:     BP 127/75   Pulse 77   Ht 6' (1.829 m)   Wt 223 lb (101.2 kg)   SpO2 97%   BMI 30.24 kg/m    Physical Exam Vitals reviewed.  Constitutional:      General: He is not in acute distress.    Appearance: Normal appearance. He is not ill-appearing.  HENT:     Head: Normocephalic and atraumatic.     Right Ear: Tympanic membrane normal.     Left Ear: Tympanic membrane normal.     Nose: Nose normal.     Mouth/Throat:     Mouth: Mucous membranes are moist.     Pharynx: Oropharynx is clear.  Eyes:     Extraocular Movements: Extraocular movements intact.     Conjunctiva/sclera:  Conjunctivae normal.     Pupils: Pupils are equal, round, and reactive to light.  Neck:     Vascular: No carotid bruit.  Cardiovascular:     Rate and Rhythm: Normal rate and regular rhythm.     Pulses: Normal pulses.     Heart sounds: Normal heart sounds.  Pulmonary:     Effort: Pulmonary effort is normal.     Breath sounds: Normal breath sounds.  Abdominal:     General: Abdomen is flat. Bowel sounds are normal. There is no distension.     Palpations: Abdomen is soft. There is no mass.     Tenderness: There is no abdominal tenderness. There is no right CVA tenderness, left CVA tenderness, guarding or rebound.  Genitourinary:    Comments: Deferred exam Musculoskeletal:        General: Normal range of motion.     Cervical back: Normal range of motion and neck supple. No tenderness.     Right lower leg: No edema.     Left lower leg: No edema.  Lymphadenopathy:     Cervical: No  cervical adenopathy.  Skin:    General: Skin is warm and dry.     Capillary Refill: Capillary refill takes less than 2 seconds.  Neurological:     General: No focal deficit present.     Mental Status: He is alert and oriented to person, place, and time. Mental status is at baseline.  Psychiatric:        Mood and Affect: Mood normal.        Behavior: Behavior normal.        Thought Content: Thought content normal.        Judgment: Judgment normal.       No results found for any visits on 07/06/24.     Assessment & Plan:    Routine Health Maintenance and Physical Exam Discussed health promotion and safety including diet and exercise recommendations, dental health, and injury prevention. Tobacco cessation if applicable. Seat belts, sunscreen, smoke detectors, etc.    Immunization History  Administered Date(s) Administered   Influenza Whole 08/14/2008   Tdap 03/03/2018    Health Maintenance  Topic Date Due   OPHTHALMOLOGY EXAM  Never done   Diabetic kidney evaluation - Urine ACR  05/01/2021   HEMOGLOBIN A1C  01/06/2024   Diabetic kidney evaluation - eGFR measurement  07/05/2024   COVID-19 Vaccine (1) 07/22/2024 (Originally 08/24/1974)   Zoster Vaccines- Shingrix (1 of 2) 10/06/2024 (Originally 08/24/1988)   INFLUENZA VACCINE  02/06/2025 (Originally 06/09/2024)   Pneumococcal Vaccine: 50+ Years (1 of 2 - PCV) 07/06/2025 (Originally 08/24/1988)   Hepatitis B Vaccines 19-59 Average Risk (1 of 3 - 19+ 3-dose series) 07/06/2025 (Originally 08/24/1988)   FOOT EXAM  07/06/2025   DTaP/Tdap/Td (2 - Td or Tdap) 03/03/2028   Colonoscopy  09/10/2030   Hepatitis C Screening  Completed   HIV Screening  Completed   HPV VACCINES  Aged Out   Meningococcal B Vaccine  Aged Out        Problem List Items Addressed This Visit       Active Problems   Type 2 diabetes mellitus without complication, without long-term current use of insulin (HCC) (Chronic)   Relevant Medications    Semaglutide ,0.25 or 0.5MG /DOS, (OZEMPIC , 0.25 OR 0.5 MG/DOSE,) 2 MG/3ML SOPN   Other Relevant Orders   Hemoglobin A1c   Microalbumin / creatinine urine ratio   Ambulatory referral to Ophthalmology   Mixed hyperlipidemia   Relevant  Orders   Lipid panel   Other Visit Diagnoses       Annual physical exam    -  Primary   Relevant Orders   CBC with Differential/Platelet   Comprehensive metabolic panel with GFR   Lipid panel   TSH   Hemoglobin A1c   Microalbumin / creatinine urine ratio         Return in about 6 months (around 01/06/2025) for chronic disease management.     Waddell KATHEE Mon, NP

## 2024-07-25 ENCOUNTER — Other Ambulatory Visit (HOSPITAL_COMMUNITY): Payer: Self-pay

## 2024-07-25 ENCOUNTER — Other Ambulatory Visit: Payer: Self-pay

## 2024-08-23 ENCOUNTER — Other Ambulatory Visit (HOSPITAL_COMMUNITY): Payer: Self-pay

## 2024-09-18 ENCOUNTER — Other Ambulatory Visit (HOSPITAL_COMMUNITY): Payer: Self-pay

## 2024-09-18 ENCOUNTER — Other Ambulatory Visit: Payer: Self-pay

## 2024-09-18 ENCOUNTER — Other Ambulatory Visit: Payer: Self-pay | Admitting: Family Medicine

## 2024-09-18 DIAGNOSIS — E119 Type 2 diabetes mellitus without complications: Secondary | ICD-10-CM

## 2024-09-18 MED ORDER — OZEMPIC (0.25 OR 0.5 MG/DOSE) 2 MG/3ML ~~LOC~~ SOPN
0.5000 mg | PEN_INJECTOR | SUBCUTANEOUS | 1 refills | Status: AC
  Filled 2024-09-18: qty 3, 28d supply, fill #0
  Filled 2024-11-06: qty 3, 28d supply, fill #1

## 2024-11-06 ENCOUNTER — Other Ambulatory Visit (HOSPITAL_BASED_OUTPATIENT_CLINIC_OR_DEPARTMENT_OTHER): Payer: Self-pay

## 2024-11-27 ENCOUNTER — Ambulatory Visit: Payer: Self-pay | Admitting: Medical

## 2024-11-27 ENCOUNTER — Other Ambulatory Visit (HOSPITAL_BASED_OUTPATIENT_CLINIC_OR_DEPARTMENT_OTHER): Payer: Self-pay

## 2024-11-27 ENCOUNTER — Ambulatory Visit (HOSPITAL_BASED_OUTPATIENT_CLINIC_OR_DEPARTMENT_OTHER)
Admission: RE | Admit: 2024-11-27 | Discharge: 2024-11-27 | Disposition: A | Discharge status: Home / Self Care | Source: Ambulatory Visit | Attending: Medical | Admitting: Medical

## 2024-11-27 ENCOUNTER — Ambulatory Visit: Payer: Self-pay

## 2024-11-27 ENCOUNTER — Ambulatory Visit: Admitting: Medical

## 2024-11-27 VITALS — BP 110/82 | HR 74 | Temp 97.4°F | Resp 16 | Ht 72.0 in | Wt 223.6 lb

## 2024-11-27 DIAGNOSIS — R10A2 Flank pain, left side: Secondary | ICD-10-CM

## 2024-11-27 DIAGNOSIS — Z87442 Personal history of urinary calculi: Secondary | ICD-10-CM | POA: Insufficient documentation

## 2024-11-27 DIAGNOSIS — R39859 Costovertebral (angle) tenderness, unspecified side: Secondary | ICD-10-CM | POA: Diagnosis present

## 2024-11-27 DIAGNOSIS — R110 Nausea: Secondary | ICD-10-CM

## 2024-11-27 LAB — CBC WITH DIFFERENTIAL/PLATELET
Basophils Absolute: 0 K/uL (ref 0.0–0.1)
Basophils Relative: 0.5 % (ref 0.0–3.0)
Eosinophils Absolute: 0 K/uL (ref 0.0–0.7)
Eosinophils Relative: 0.5 % (ref 0.0–5.0)
HCT: 47.9 % (ref 39.0–52.0)
Hemoglobin: 16.7 g/dL (ref 13.0–17.0)
Lymphocytes Relative: 25.6 % (ref 12.0–46.0)
Lymphs Abs: 1.9 K/uL (ref 0.7–4.0)
MCHC: 34.8 g/dL (ref 30.0–36.0)
MCV: 88.3 fl (ref 78.0–100.0)
Monocytes Absolute: 0.5 K/uL (ref 0.1–1.0)
Monocytes Relative: 6.4 % (ref 3.0–12.0)
Neutro Abs: 5 K/uL (ref 1.4–7.7)
Neutrophils Relative %: 67 % (ref 43.0–77.0)
Platelets: 217 K/uL (ref 150.0–400.0)
RBC: 5.42 Mil/uL (ref 4.22–5.81)
RDW: 12.6 % (ref 11.5–15.5)
WBC: 7.5 K/uL (ref 4.0–10.5)

## 2024-11-27 LAB — POC URINALSYSI DIPSTICK (AUTOMATED)
Bilirubin, UA: NEGATIVE
Blood, UA: NEGATIVE
Glucose, UA: NEGATIVE
Ketones, UA: 80
Leukocytes, UA: NEGATIVE
Nitrite, UA: NEGATIVE
Protein, UA: NEGATIVE
Spec Grav, UA: 1.01
Urobilinogen, UA: 0.2 U/dL
pH, UA: 6

## 2024-11-27 LAB — COMPREHENSIVE METABOLIC PANEL WITH GFR
ALT: 27 U/L (ref 3–53)
AST: 18 U/L (ref 5–37)
Albumin: 4.7 g/dL (ref 3.5–5.2)
Alkaline Phosphatase: 67 U/L (ref 39–117)
BUN: 13 mg/dL (ref 6–23)
CO2: 21 meq/L (ref 19–32)
Calcium: 9.7 mg/dL (ref 8.4–10.5)
Chloride: 102 meq/L (ref 96–112)
Creatinine, Ser: 0.92 mg/dL (ref 0.40–1.50)
GFR: 93.81 mL/min
Glucose, Bld: 109 mg/dL — ABNORMAL HIGH (ref 70–99)
Potassium: 3.9 meq/L (ref 3.5–5.1)
Sodium: 135 meq/L (ref 135–145)
Total Bilirubin: 0.9 mg/dL (ref 0.2–1.2)
Total Protein: 7.5 g/dL (ref 6.0–8.3)

## 2024-11-27 LAB — LIPASE: Lipase: 38 U/L (ref 11.0–59.0)

## 2024-11-27 MED ORDER — AMOXICILLIN-POT CLAVULANATE 875-125 MG PO TABS
1.0000 | ORAL_TABLET | Freq: Two times a day (BID) | ORAL | 0 refills | Status: AC
  Filled 2024-11-27: qty 20, 10d supply, fill #0

## 2024-11-27 MED ORDER — ONDANSETRON HCL 4 MG PO TABS
4.0000 mg | ORAL_TABLET | Freq: Three times a day (TID) | ORAL | 0 refills | Status: AC | PRN
  Filled 2024-11-27: qty 20, 7d supply, fill #0

## 2024-11-27 MED ORDER — OXYCODONE-ACETAMINOPHEN 5-325 MG PO TABS
1.0000 | ORAL_TABLET | Freq: Three times a day (TID) | ORAL | 0 refills | Status: DC | PRN
  Filled 2024-11-27: qty 12, 4d supply, fill #0

## 2024-11-27 NOTE — Progress Notes (Incomplete)
 "  Acute Office Visit  Subjective:  Patient ID: Brian Pierce, male    DOB: 04-Mar-1971  Age: 54 y.o. MRN: [id number]  CC: No chief complaint on file.     HPI Brian Pierce is here for radiating back pain.   Past Medical History:  Diagnosis Date   Arthritis    Back pain    lower   Dizziness    Dizzy spells     work injury hit with piece of steel feel dizzy and wavy feeling without pain hit with steel 2 months ago   Elevated liver enzymes 06/2018   GERD (gastroesophageal reflux disease)    Headache    High cholesterol    History of kidney stones    Poor balance    Prediabetes    Substance abuse (HCC)    Drinking struggles   Umbilical hernia     Past Surgical History:  Procedure Laterality Date   COLONOSCOPY     over 10 yrs   COLONOSCOPY WITH PROPOFOL  N/A 09/10/2020   Procedure: COLONOSCOPY WITH PROPOFOL ;  Surgeon: Leigh Elspeth SQUIBB, MD;  Location: [facility];  Service: Gastroenterology;  Laterality: N/A;   colonscopy and endoscopy  12 yrs ago   HERNIA REPAIR Bilateral age 26   inguinal    POLYPECTOMY  09/10/2020   Procedure: POLYPECTOMY;  Surgeon: Leigh Elspeth SQUIBB, MD;  Location: [facility];  Service: Gastroenterology;;   UMBILICAL HERNIA REPAIR N/A 01/10/2019   Procedure: LAPAROSCOPIC ASSISTED REPAIR OF UMBILICAL HERNIA WITH MESH ERAS PATHWAY;  Surgeon: Tanda Locus, MD;  Location: [facility];  Service: General;  Laterality: N/A;   UPPER GASTROINTESTINAL ENDOSCOPY     over 10 yrs   wisdom teeth extraction      Family History  Problem Relation Age of Onset   Diabetes Father    Colon cancer Neg Hx    Colon polyps Neg Hx    Esophageal cancer Neg Hx    Rectal cancer Neg Hx    Prostate cancer Neg Hx     Social History   Socioeconomic History   Marital status: Married    Spouse name: Dorothyann   Number of children: 3   Years of education: Not on file   Highest education level: High school graduate  Occupational History    Comment: landscaping   Tobacco Use   Smoking status: Former    Current packs/day: 0.00    Average packs/day: 0.5 packs/day    Types: Cigars, Cigarettes    Quit date: 01/03/2022    Years since quitting: 2.9   Smokeless tobacco: Never  Vaping Use   Vaping status: Never Used  Substance and Sexual Activity   Alcohol use: Yes    Comment: 4-5 drinks per day,    Drug use: Yes    Types: Marijuana    Comment: marijuana last used Sep 08, 2018   Sexual activity: Not on file  Other Topics Concern   Not on file  Social History Narrative   Lives with spouse   caffeine- 2-3 cups daily   Social Drivers of Health   Tobacco Use: Medium Risk (07/06/2024)   Patient History    Smoking Tobacco Use: Former    Smokeless Tobacco Use: Never    Passive Exposure: Not on file  Financial Resource Strain: Low Risk  (01/04/2022)   Received from [facility]   Overall Financial Resource Strain (CARDIA)    Difficulty of Paying Living Expenses: Not hard at all  Food Insecurity: Not on file  Transportation Needs: Not on file  Physical Activity: Not on file  Stress: Not on file  Social Connections: Unknown (03/24/2022)   [facility]   Social Network    Social Network: Not on file  Intimate Partner Violence: Unknown (02/13/2022)   Received from [facility]   HITS    Physically Hurt: Not on file    Insult or Talk Down To: Not on file    Threaten Physical Harm: Not on file    Scream or Curse: Not on file  Depression (PHQ2-9): Low Risk (07/06/2024)   Depression (PHQ2-9)    PHQ-2 Score: 0  Alcohol Screen: Not on file  Housing: Not on file  Utilities: Not on file  Health Literacy: Not on file    ROS All ROS negative except what is listed in the HPI.   Objective:   Today's Vitals: There were no vitals taken for this visit.  Physical Exam  Assessment & Plan:   Problem List Items Addressed This Visit   None     Follow-up: No follow-ups on file.   Waddell FURY Almarie, DNP, FNP-C  I,Emily  Lagle,acting as a neurosurgeon for Waddell KATHEE Almarie, NP.,have documented all relevant documentation on the behalf of Waddell KATHEE Almarie, NP.   I, Waddell KATHEE Almarie, NP, have reviewed all documentation for this visit. The documentation on 11/28/2024 for the exam, diagnosis, procedures, and orders are all accurate and complete.  "

## 2024-11-27 NOTE — Telephone Encounter (Signed)
 FYI Only or Action Required?: Action required by provider: request for appointment.  Patient was last seen in primary care on 07/06/2024 by Almarie Waddell NOVAK, NP.  Called Nurse Triage reporting Back Pain.  Symptoms began several days ago.  Interventions attempted: OTC medications: Tylenol , Motrin .  Symptoms are: gradually worsening.Low back pain, radiates into abdomen. Pain worsening.  Triage Disposition: See HCP Within 4 Hours (Or PCP Triage)  Patient/caregiver understands and will follow disposition?: YesReason for Triage: Patient back pain has been getting significantly worse over the worse over the weekend been ongoing for a week   Reason for Disposition  [1] SEVERE back pain (e.g., excruciating, unable to do any normal activities) AND [2] not improved 2 hours after pain medicine  Answer Assessment - Initial Assessment Questions 1. ONSET: When did the pain begin? (e.g., minutes, hours, days)     2 weeks 2. LOCATION: Where does it hurt? (upper, mid or lower back)     Lower left and radiates to abdomen 3. SEVERITY: How bad is the pain?  (e.g., Scale 1-10; mild, moderate, or severe)     severe 4. PATTERN: Is the pain constant? (e.g., yes, no; constant, intermittent)      Constant now 5. RADIATION: Does the pain shoot into your legs or somewhere else?     stomach 6. CAUSE:  What do you think is causing the back pain?      unsure 7. BACK OVERUSE:  Any recent lifting of heavy objects, strenuous work or exercise?     Maybe at work 8. MEDICINES: What have you taken so far for the pain? (e.g., nothing, acetaminophen , NSAIDS)     Tylenol  9. NEUROLOGIC SYMPTOMS: Do you have any weakness, numbness, or problems with bowel/bladder control?     no 10. OTHER SYMPTOMS: Do you have any other symptoms? (e.g., fever, abdomen pain, burning with urination, blood in urine)       no 11. PREGNANCY: Is there any chance you are pregnant? When was your last menstrual period?        N/a  Protocols used: Back Pain-A-AH

## 2024-11-27 NOTE — Progress Notes (Signed)
 "  Subjective:    Patient ID: Brian Pierce, male    DOB: 12-18-1970, 54 y.o.   MRN: [id number]  HPI  Brian Pierce is a 54 year old male with a history of kidney stones who presents with severe left-sided back pain.  He has severe left-sided back pain centered over left cva area and toward the flank. Very rarely radiating to the upper leg and buttock. no mid lumbar pain. No rash on back and no obvious constipation The pain started Thursday, worsened by Friday evening, and was almost unbearable over the weekend. Baseline intensity is 5-6 out of 10, increasing to 8-9 out of 10 with  certain movements, and has at times caused him to scream. It does not improve with position changes, though brief standing or walking provides some relief.  Pt has no history of abd surgery except for umbilical hernia surgery.  He has prior kidney stones on both sides and feels this pain is similar. He has not seen visible hematuria but thinks microscopic hematuria is possible. He has not had recent abdominal surgery. He had a hernia repair in 2020.  The pain causes severe sleep disruption, and he has slept only about two hours per night since Friday due to inability to find a comfortable position.  He recently did unusually heavy lifting at work but does not recall a single triggering event.  He has nausea. No pain with urination, diarrhea, vomiting, or numbness in the feet, though pain radiates into the leg.  Pt has had daily bm. Some straining at times to pass bm but when desribe normal volume stool/shape.   Review of Systems     Objective:   Physical Exam  General Mental Status- Alert. General Appearance- Not in acute distress. Severity of pain seems to vary during exam appears moderate uncomfortable.  Skin No rash on skin  Neck Carotid Arteries- Normal color. Moisture- Normal Moisture. No carotid bruits. No JVD.  Chest and Lung Exam Auscultation: Breath  Sounds:-CTA  Cardiovascular Auscultation:Rythm- RRR Murmurs & Other Heart Sounds:Auscultation of the heart reveals- No Murmurs.  Abdomen Inspection:-Inspeection Normal. Palpation/Percussion:Note:No mass. Palpation and Percussion of the abdomen reveal- left flank Tender, Non Distended + BS, no rebound or guarding.   Neurologic Cranial Nerve exam:- CN III-XII intact(No nystagmus), symmetric smile. Strength:- 5/5 equal and symmetric strength both upper and lower extremities.   Back- no mid lumbar pain but left cva tenderness to palpation.     Assessment & Plan:   Patient Instructions  Acute left flank pain with costovertebral angle tenderness, rule out nephrolithiasis Severe left flank pain radiating to leg and buttock(not typical) , likely nephrolithiasis. History of kidney stones. Pain similar to previous episodes. Differential includes diverticulitis and lumbar back pain. - Ordered CT renal stone protocol. - Ordered urine sample for blood and culture. - Ordered CBC, metabolic panel, and lipase. - Prescribed oxycodone  5/325 mg for pain. - Advised hydration and Miralax use.(avoid constipation) - Prescribed anti-nausea medication. zofran  Nausea associated with acute pain Nausea likely secondary to severe pain, no vomiting. - Prescribed anti-nausea medication.  Constipation - Advised Miralax use. - Encouraged hydration and bland diet.   Follow up date to be determined after lab and imaging review. Please update me how you are by tomorrow afternoon   Dallas Maxwell, PA-C   I personally spent a total of 45 minutes in the care of the patient today including performing a medically appropriate exam/evaluation, counseling and educating, placing orders, documenting clinical information in the EHR,  and communicating results.  "

## 2024-11-27 NOTE — Patient Instructions (Signed)
 Acute left flank pain with costovertebral angle tenderness, rule out nephrolithiasis Severe left flank pain radiating to leg and buttock(not typical) , likely nephrolithiasis. History of kidney stones. Pain similar to previous episodes. Differential includes diverticulitis and lumbar back pain. - Ordered CT renal stone protocol. - Ordered urine sample for blood and culture. - Ordered CBC, metabolic panel, and lipase. - Prescribed oxycodone  5/325 mg for pain. - Advised hydration and Miralax use.(avoid constipation) - Prescribed anti-nausea medication. zofran  Nausea associated with acute pain Nausea likely secondary to severe pain, no vomiting. - Prescribed anti-nausea medication.  Constipation - Advised Miralax use. - Encouraged hydration and bland diet.   Follow up date to be determined after lab and imaging review. Please update me how you are by tomorrow afternoon

## 2024-11-28 ENCOUNTER — Other Ambulatory Visit (HOSPITAL_BASED_OUTPATIENT_CLINIC_OR_DEPARTMENT_OTHER): Payer: Self-pay

## 2024-11-28 ENCOUNTER — Ambulatory Visit: Admitting: Family Medicine

## 2024-11-28 ENCOUNTER — Encounter: Payer: Self-pay | Admitting: Medical

## 2024-11-28 LAB — URINE CULTURE
MICRO NUMBER:: 17485890
Result:: NO GROWTH
SPECIMEN QUALITY:: ADEQUATE

## 2024-11-29 ENCOUNTER — Ambulatory Visit: Payer: Self-pay

## 2024-11-29 NOTE — Telephone Encounter (Signed)
 FYI Only or Action Required?: FYI only for provider: ED advised.  Patient was last seen in primary care on 11/27/2024 by Dorina Loving, PA-C.  Called Nurse Triage reporting Pain.  Symptoms began flank several days, abdominal symptoms started today.  Interventions attempted: Prescription medications: percocet.  Symptoms are: gradually worsening.  Triage Disposition: Go to ED Now (or PCP Triage)  Patient/caregiver understands and will follow disposition?: Yes  Reason for Disposition  [1] SEVERE pain (e.g., excruciating, scale 8-10) AND [2] NOT improved after pain medicine  [1] SEVERE pain (e.g., excruciating) AND [2] present > 1 hour  Answer Assessment - Initial Assessment Questions Patient wife calling in on his behalf for severe abdominal pain. Patient was diagnosed with kidney stone on 11/27/24, using percocet for flank pain relief. Today new pain developed in his abdomen that is severe, he reported to spouse he thought it is from constipation-used Miralax without relief. Spouse is at work and not with patient at this time, she believes his last bm was on Monday. His abdominal pain and flank pain are severe causing him to scream out and cry in pain. ED advised for progression of symptoms and new onset severe central abdominal pain.   1. MAIN CONCERN OR SYMPTOM:  What is your main concern right now? What question do you have? What's the main symptom you're worried about? (e.g., blood in urine, flank pain)     Abdominal pain-questioning constipation 2. ONSET: When did the  abdominal pain start?     today 3. BETTER-SAME-WORSE: Are you getting better, staying the same, or getting worse compared to how you felt at your last visit to the doctor (most recent medical visit)?     worse 4. VISIT DATE: When were you seen? (Date)     11/27/24 5. VISIT DOCTOR: What is the name of the doctor (or NP/PA) taking care of you now?      6. VISIT DIAGNOSIS:  What was the main symptom or  problem that you were seen for? Were you given a diagnosis?       7. TREATMENT: Did you have any treatment for your kidney stone? (e.g., none, doctor exam, lithotripsy, medicines, stent) If Yes, ask: When did you have this treatment?     Increase hydration, pain med 8. NEXT APPOINTMENT: Do you have a follow-up appointment with your doctor?     yes 9. PAIN: Is there any pain? If Yes, ask: How bad is it?  (Scale 0-10; or none, mild, moderate, severe)     severe 10. FEVER: Do you have a fever? If Yes, ask: What is it, how was it measured, and when did it start?       denies 11. OTHER SYMPTOMS: Do you have any other symptoms? (e.g., abdomen pain, blood in urine, vomiting)        Feels constipated, having abdominal pain.  Protocols used: Kidney Stone Follow-up Call-A-[facility], Abdominal Pain - Male-A-[facility]  Message from Viburnum G sent at 11/29/2024  2:34 PM EST  Reason for Triage: Pt was in for a kidney stone (diverticulitis). Pt can't use the bathroom, constipated, and he is in pain

## 2024-11-30 ENCOUNTER — Encounter: Payer: Self-pay | Admitting: Family Medicine

## 2024-11-30 ENCOUNTER — Ambulatory Visit: Admitting: Family Medicine

## 2024-11-30 ENCOUNTER — Other Ambulatory Visit (HOSPITAL_BASED_OUTPATIENT_CLINIC_OR_DEPARTMENT_OTHER): Payer: Self-pay

## 2024-11-30 VITALS — BP 124/80 | HR 68 | Temp 97.4°F | Ht 72.0 in | Wt 223.0 lb

## 2024-11-30 DIAGNOSIS — M545 Low back pain, unspecified: Secondary | ICD-10-CM | POA: Diagnosis not present

## 2024-11-30 DIAGNOSIS — R1032 Left lower quadrant pain: Secondary | ICD-10-CM | POA: Diagnosis not present

## 2024-11-30 MED ORDER — CYCLOBENZAPRINE HCL 5 MG PO TABS
5.0000 mg | ORAL_TABLET | Freq: Three times a day (TID) | ORAL | 1 refills | Status: AC | PRN
  Filled 2024-11-30: qty 30, 10d supply, fill #0

## 2024-11-30 MED ORDER — TRAMADOL HCL 50 MG PO TABS
50.0000 mg | ORAL_TABLET | Freq: Three times a day (TID) | ORAL | 0 refills | Status: AC | PRN
  Filled 2024-11-30: qty 15, 5d supply, fill #0

## 2024-11-30 NOTE — Progress Notes (Signed)
 "  Established Patient Office Visit Subjective:  Patient ID: Brian Pierce, male    DOB: [birth year]  Age: 54 y.o. MRN: [id number]  CC:  Chief Complaint  Patient presents with   Medical Management of Chronic Issues      HPI Brian Pierce is here for follow up on kidney stone. He was seen by PA-C Saguier on 11/27/2024 for severe left-sided flank pain. He has a history of kidney stones and CT renal confirmed a nonobstructive bilateral renal calculi measuring up to 4 mm. No hydronephrosis. No ureteral or bladder calculi, as well as left colonic diverticulosis (non-contrast CT may not show diverticulitis well so he was treated empirically with Augmentin , just started yesterday). For pain management he was prescribed oxycodone -acetaminophen  as needed.  On 11/30/2023, he called reported new onset of severe abdominal pain.   Discussed the use of AI scribe software for clinical note transcription with the patient, who gave verbal consent to proceed.  History of Present Illness Brian Pierce is a 54 year old male who presents to follow-up on severe left-sided abdominal pain, low back pain, and nausea.  He has been experiencing severe pain in the left kidney area since Thursday afternoon (1 week ago), which was so intense it caused nausea and vomiting at work. The pain persisted through the weekend, with some improvement noted by Friday morning, although he had to leave work early due to discomfort.  He was seen on Monday and had labs/imaging. See summary above. He started antibiotics yesterday and has been on a gentle diet for the past three to four days. He notes some blood in his stool. He has been using a heating pad and lidocaine  for pain relief, which provides temporary relief.  He has been prescribed Percocet for pain, which he has used sparingly, and has also been given nausea medication. He reports difficulty sleeping due to back pain and has missed a week of work. He has been taking a  stool softener due to concerns about constipation.  No burning sensation during urination and no fever. He maintains a healthy lifestyle, including regular exercise and abstaining from alcohol. Currently he feels that he may be turning the corner as pain is less severe - he is requesting a slightly more mild pain medication.        Past Medical History:  Diagnosis Date   Arthritis    Back pain    lower   Dizziness    Dizzy spells     work injury hit with piece of steel feel dizzy and wavy feeling without pain hit with steel 2 months ago   Elevated liver enzymes 06/2018   GERD (gastroesophageal reflux disease)    Headache    High cholesterol    History of kidney stones    Poor balance    Prediabetes    Substance abuse (HCC)    Drinking struggles   Umbilical hernia     Past Surgical History:  Procedure Laterality Date   COLONOSCOPY     over 10 yrs   COLONOSCOPY WITH PROPOFOL  N/A 09/10/2020   Procedure: COLONOSCOPY WITH PROPOFOL ;  Surgeon: Leigh Elspeth SQUIBB, MD;  Location: [facility];  Service: Gastroenterology;  Laterality: N/A;   colonscopy and endoscopy  12 yrs ago   HERNIA REPAIR Bilateral age 62   inguinal    POLYPECTOMY  09/10/2020   Procedure: POLYPECTOMY;  Surgeon: Leigh Elspeth SQUIBB, MD;  Location: [facility];  Service: Gastroenterology;;   UMBILICAL HERNIA REPAIR N/A 01/10/2019  Procedure: LAPAROSCOPIC ASSISTED REPAIR OF UMBILICAL HERNIA WITH MESH ERAS PATHWAY;  Surgeon: Tanda Locus, MD;  Location: [facility];  Service: General;  Laterality: N/A;   UPPER GASTROINTESTINAL ENDOSCOPY     over 10 yrs   wisdom teeth extraction      Family History  Problem Relation Age of Onset   Diabetes Father    Colon cancer Neg Hx    Colon polyps Neg Hx    Esophageal cancer Neg Hx    Rectal cancer Neg Hx    Prostate cancer Neg Hx     Social History   Socioeconomic History   Marital status: Married    Spouse name: Dorothyann   Number of children: 3   Years of  education: Not on file   Highest education level: High school graduate  Occupational History    Comment: landscaping  Tobacco Use   Smoking status: Former    Current packs/day: 0.00    Average packs/day: 0.5 packs/day    Types: Cigars, Cigarettes    Quit date: 01/03/2022    Years since quitting: 2.9   Smokeless tobacco: Never  Vaping Use   Vaping status: Never Used  Substance and Sexual Activity   Alcohol use: Yes    Comment: 4-5 drinks per day,    Drug use: Yes    Types: Marijuana    Comment: marijuana last used Sep 08, 2018   Sexual activity: Not on file  Other Topics Concern   Not on file  Social History Narrative   Lives with spouse   caffeine- 2-3 cups daily   Social Drivers of Health   Tobacco Use: Medium Risk (11/30/2024)   Patient History    Smoking Tobacco Use: Former    Smokeless Tobacco Use: Never    Passive Exposure: Not on file  Financial Resource Strain: Low Risk (01/04/2022)   Received from [facility]   Overall Financial Resource Strain (CARDIA)    Difficulty of Paying Living Expenses: Not hard at all  Food Insecurity: Not on file  Transportation Needs: Not on file  Physical Activity: Not on file  Stress: Not on file  Social Connections: Not on file  Intimate Partner Violence: Not on file  Depression (PHQ2-9): Medium Risk (11/30/2024)   Depression (PHQ2-9)    PHQ-2 Score: 7  Alcohol Screen: Not on file  Housing: Not on file  Utilities: Not on file  Health Literacy: Not on file    ROS All ROS negative except what is listed in the HPI.   Objective:   Today's Vitals: BP 124/80   Pulse 68   Temp (!) 97.4 F (36.3 C) (Oral)   Ht 6' (1.829 m)   Wt 223 lb (101.2 kg)   SpO2 96%   BMI 30.24 kg/m   Physical Exam Vitals reviewed.  Constitutional:      Appearance: Normal appearance.  Cardiovascular:     Rate and Rhythm: Normal rate and regular rhythm.     Heart sounds: Normal heart sounds.  Pulmonary:     Effort: Pulmonary effort is  normal.     Breath sounds: Normal breath sounds.  Abdominal:     Tenderness: There is abdominal tenderness in the left upper quadrant and left lower quadrant. There is no right CVA tenderness or left CVA tenderness.  Skin:    General: Skin is warm and dry.  Neurological:     Mental Status: He is alert and oriented to person, place, and time.  Psychiatric:  Mood and Affect: Mood normal.        Behavior: Behavior normal.        Thought Content: Thought content normal.        Judgment: Judgment normal.        Assessment & Plan:   Problem List Items Addressed This Visit   None Visit Diagnoses       LLQ pain    -  Primary   Relevant Medications   cyclobenzaprine  (FLEXERIL ) 5 MG tablet   traMADol  (ULTRAM ) 50 MG tablet     Acute left-sided low back pain without sciatica       Relevant Medications   cyclobenzaprine  (FLEXERIL ) 5 MG tablet   traMADol  (ULTRAM ) 50 MG tablet      Assessment & Plan  - Continue antibiotics as prescribed. - Maintain clear liquid diet for 24-48 hours, then gradually reintroduce bland foods as tolerated. - Monitor for blood in stool; if persistent after antibiotics, consider GI referral. If significant amount, go to ED.  - Use heating pad to low back for comfort - some recent heavy lifting may also be contributing to low back pain given somewhat positional. - Prescribed muscle relaxer for spasms, up to three times a day. - Prescribed tramadol  for moderate pain management, ensuring not to mix with oxycodone  - has a couple tabs left, to be reserved for severe pain. - Monitor for high fevers, severe pain, or significant blood loss; seek hospital care if these occur.  Patient aware of signs/symptoms requiring further/urgent evaluation.     Work note provided.     Follow-up: Return if symptoms worsen or fail to improve.   Waddell FURY Almarie, DNP, FNP-C  I,Emily Lagle,acting as a neurosurgeon for Waddell KATHEE Almarie, NP.,have documented all relevant  documentation on the behalf of Waddell KATHEE Almarie, NP.  I, Waddell KATHEE Almarie, NP, have reviewed all documentation for this visit. The documentation on 11/30/2024 for the exam, diagnosis, procedures, and orders are all accurate and complete.  "
